# Patient Record
Sex: Female | Born: 1955 | Race: White | Hispanic: No | Marital: Married | Smoking: Never smoker
Health system: Southern US, Community
[De-identification: ages and names within clinical notes are randomized; demographics above are authoritative.]

## PROBLEM LIST (undated history)

## (undated) DIAGNOSIS — R51 Headache: Secondary | ICD-10-CM

## (undated) DIAGNOSIS — R569 Unspecified convulsions: Secondary | ICD-10-CM

## (undated) DIAGNOSIS — M1990 Unspecified osteoarthritis, unspecified site: Secondary | ICD-10-CM

## (undated) DIAGNOSIS — C801 Malignant (primary) neoplasm, unspecified: Secondary | ICD-10-CM

## (undated) DIAGNOSIS — K5792 Diverticulitis of intestine, part unspecified, without perforation or abscess without bleeding: Secondary | ICD-10-CM

## (undated) DIAGNOSIS — N189 Chronic kidney disease, unspecified: Secondary | ICD-10-CM

## (undated) HISTORY — PX: RECTAL SURGERY: SHX760

## (undated) HISTORY — PX: CHOLECYSTECTOMY: SHX55

## (undated) HISTORY — PX: TUBAL LIGATION: SHX77

---

## 2002-01-01 ENCOUNTER — Emergency Department (HOSPITAL_COMMUNITY): Admission: EM | Admit: 2002-01-01 | Discharge: 2002-01-01 | Payer: Self-pay | Admitting: Emergency Medicine

## 2011-10-07 ENCOUNTER — Encounter (HOSPITAL_COMMUNITY)
Admission: EM | Disposition: A | Discharge status: Home / Self Care | Payer: Self-pay | Source: Home / Self Care | Attending: Urology

## 2011-10-07 ENCOUNTER — Inpatient Hospital Stay (HOSPITAL_COMMUNITY): Payer: BC Managed Care – PPO | Admitting: Anesthesiology

## 2011-10-07 ENCOUNTER — Ambulatory Visit: Admit: 2011-10-07 | Payer: Self-pay | Admitting: Urology

## 2011-10-07 ENCOUNTER — Encounter (HOSPITAL_COMMUNITY): Payer: Self-pay | Admitting: Anesthesiology

## 2011-10-07 ENCOUNTER — Emergency Department (HOSPITAL_COMMUNITY): Payer: BC Managed Care – PPO

## 2011-10-07 ENCOUNTER — Inpatient Hospital Stay (HOSPITAL_COMMUNITY)
Admission: EM | Admit: 2011-10-07 | Discharge: 2011-10-08 | DRG: 323 | Disposition: A | Discharge status: Home / Self Care | Payer: BC Managed Care – PPO | Attending: Urology | Admitting: Urology

## 2011-10-07 ENCOUNTER — Encounter (HOSPITAL_COMMUNITY): Payer: Self-pay | Admitting: *Deleted

## 2011-10-07 DIAGNOSIS — N200 Calculus of kidney: Principal | ICD-10-CM | POA: Diagnosis present

## 2011-10-07 DIAGNOSIS — N1330 Unspecified hydronephrosis: Secondary | ICD-10-CM | POA: Diagnosis present

## 2011-10-07 DIAGNOSIS — N138 Other obstructive and reflux uropathy: Secondary | ICD-10-CM

## 2011-10-07 HISTORY — PX: CYSTOSCOPY W/ URETERAL STENT PLACEMENT: SHX1429

## 2011-10-07 LAB — COMPREHENSIVE METABOLIC PANEL
AST: 23 U/L (ref 0–37)
Albumin: 4.4 g/dL (ref 3.5–5.2)
Chloride: 100 mEq/L (ref 96–112)
Creatinine, Ser: 0.66 mg/dL (ref 0.50–1.10)
Potassium: 3.6 mEq/L (ref 3.5–5.1)
Total Bilirubin: 0.4 mg/dL (ref 0.3–1.2)

## 2011-10-07 LAB — URINALYSIS, ROUTINE W REFLEX MICROSCOPIC
Ketones, ur: NEGATIVE mg/dL
Nitrite: POSITIVE — AB
Protein, ur: NEGATIVE mg/dL
Urobilinogen, UA: 0.2 mg/dL (ref 0.0–1.0)

## 2011-10-07 LAB — CBC WITH DIFFERENTIAL/PLATELET
Basophils Absolute: 0 10*3/uL (ref 0.0–0.1)
Basophils Relative: 0 % (ref 0–1)
MCHC: 35.4 g/dL (ref 30.0–36.0)
Monocytes Absolute: 0.4 10*3/uL (ref 0.1–1.0)
Neutro Abs: 4.5 10*3/uL (ref 1.7–7.7)
Neutrophils Relative %: 65 % (ref 43–77)
RDW: 11.7 % (ref 11.5–15.5)

## 2011-10-07 LAB — URINE MICROSCOPIC-ADD ON

## 2011-10-07 SURGERY — CYSTOSCOPY, WITH RETROGRADE PYELOGRAM AND URETERAL STENT INSERTION
Anesthesia: General | Site: Ureter | Laterality: Left | Wound class: Clean Contaminated

## 2011-10-07 MED ORDER — DEXTROSE 5 % IV SOLN
1.0000 g | Freq: Once | INTRAVENOUS | Status: AC
  Administered 2011-10-07: 1 g via INTRAVENOUS
  Filled 2011-10-07: qty 10

## 2011-10-07 MED ORDER — HYDROMORPHONE HCL PF 1 MG/ML IJ SOLN
1.0000 mg | Freq: Once | INTRAMUSCULAR | Status: AC
  Administered 2011-10-07: 1 mg via INTRAVENOUS
  Filled 2011-10-07: qty 1

## 2011-10-07 MED ORDER — EPHEDRINE SULFATE 50 MG/ML IJ SOLN
INTRAMUSCULAR | Status: DC | PRN
  Administered 2011-10-07: 5 mg via INTRAVENOUS

## 2011-10-07 MED ORDER — SUCCINYLCHOLINE CHLORIDE 20 MG/ML IJ SOLN
INTRAMUSCULAR | Status: DC | PRN
  Administered 2011-10-07: 100 mg via INTRAVENOUS

## 2011-10-07 MED ORDER — ONDANSETRON HCL 4 MG/2ML IJ SOLN: 4.0000 mg | INTRAMUSCULAR | Status: DC | PRN

## 2011-10-07 MED ORDER — LACTATED RINGERS IV SOLN: INTRAVENOUS | Status: DC

## 2011-10-07 MED ORDER — OMEGA-3-ACID ETHYL ESTERS 1 G PO CAPS
2.0000 g | ORAL_CAPSULE | Freq: Every day | ORAL | Status: DC
  Administered 2011-10-08: 2 g via ORAL
  Filled 2011-10-07: qty 2

## 2011-10-07 MED ORDER — DEXTROSE-NACL 5-0.45 % IV SOLN
INTRAVENOUS | Status: DC
  Administered 2011-10-08: via INTRAVENOUS

## 2011-10-07 MED ORDER — HYDROCODONE-ACETAMINOPHEN 5-325 MG PO TABS: 1.0000 | ORAL_TABLET | ORAL | Status: DC | PRN

## 2011-10-07 MED ORDER — FENTANYL CITRATE 0.05 MG/ML IJ SOLN
INTRAMUSCULAR | Status: DC | PRN
  Administered 2011-10-07 (×2): 50 ug via INTRAVENOUS

## 2011-10-07 MED ORDER — ACETAMINOPHEN 10 MG/ML IV SOLN
INTRAVENOUS | Status: DC | PRN
  Administered 2011-10-07: 1000 mg via INTRAVENOUS

## 2011-10-07 MED ORDER — ZOLPIDEM TARTRATE 5 MG PO TABS: 5.0000 mg | ORAL_TABLET | Freq: Every evening | ORAL | Status: DC | PRN

## 2011-10-07 MED ORDER — FENTANYL CITRATE 0.05 MG/ML IJ SOLN: 25.0000 ug | INTRAMUSCULAR | Status: DC | PRN

## 2011-10-07 MED ORDER — PROPOFOL 10 MG/ML IV EMUL
INTRAVENOUS | Status: DC | PRN
  Administered 2011-10-07: 150 mg via INTRAVENOUS

## 2011-10-07 MED ORDER — MIDAZOLAM HCL 5 MG/5ML IJ SOLN
INTRAMUSCULAR | Status: DC | PRN
  Administered 2011-10-07: 1 mg via INTRAVENOUS

## 2011-10-07 MED ORDER — HYDROMORPHONE HCL PF 1 MG/ML IJ SOLN: 0.5000 mg | INTRAMUSCULAR | Status: DC | PRN

## 2011-10-07 MED ORDER — CLONAZEPAM 0.5 MG PO TABS: 0.5000 mg | ORAL_TABLET | Freq: Two times a day (BID) | ORAL | Status: DC | PRN

## 2011-10-07 MED ORDER — ONDANSETRON HCL 4 MG/2ML IJ SOLN
4.0000 mg | Freq: Once | INTRAMUSCULAR | Status: AC
  Administered 2011-10-07: 4 mg via INTRAVENOUS
  Filled 2011-10-07: qty 2

## 2011-10-07 MED ORDER — LACTATED RINGERS IV SOLN
INTRAVENOUS | Status: DC | PRN
  Administered 2011-10-07 (×2): via INTRAVENOUS

## 2011-10-07 MED ORDER — METOCLOPRAMIDE HCL 5 MG/ML IJ SOLN
10.0000 mg | Freq: Once | INTRAMUSCULAR | Status: AC
  Administered 2011-10-07: 10 mg via INTRAVENOUS
  Filled 2011-10-07: qty 2

## 2011-10-07 MED ORDER — IOHEXOL 300 MG/ML  SOLN
INTRAMUSCULAR | Status: DC | PRN
  Administered 2011-10-07: 10 mL

## 2011-10-07 MED ORDER — LACOSAMIDE 50 MG PO TABS
50.0000 mg | ORAL_TABLET | Freq: Two times a day (BID) | ORAL | Status: DC
  Administered 2011-10-08: 50 mg via ORAL
  Filled 2011-10-07: qty 1

## 2011-10-07 MED ORDER — LORAZEPAM 2 MG/ML IJ SOLN
1.0000 mg | Freq: Once | INTRAMUSCULAR | Status: AC
  Administered 2011-10-07: 1 mg via INTRAVENOUS
  Filled 2011-10-07: qty 1

## 2011-10-07 MED ORDER — LIDOCAINE HCL (CARDIAC) 20 MG/ML IV SOLN
INTRAVENOUS | Status: DC | PRN
  Administered 2011-10-07: 30 mg via INTRAVENOUS

## 2011-10-07 SURGICAL SUPPLY — 16 items
ADAPTER CATH URET PLST 4-6FR (CATHETERS) ×2 IMPLANT
BAG URO CATCHER STRL LF (DRAPE) ×2 IMPLANT
CATH INTERMIT  6FR 70CM (CATHETERS) IMPLANT
CATH URET 5FR 28IN CONE TIP (BALLOONS) ×1
CATH URET 5FR 70CM CONE TIP (BALLOONS) ×1 IMPLANT
CLOTH BEACON ORANGE TIMEOUT ST (SAFETY) ×2 IMPLANT
DRAPE CAMERA CLOSED 9X96 (DRAPES) ×2 IMPLANT
GLOVE BIOGEL M 7.0 STRL (GLOVE) ×2 IMPLANT
GOWN STRL NON-REIN LRG LVL3 (GOWN DISPOSABLE) ×2 IMPLANT
MANIFOLD NEPTUNE II (INSTRUMENTS) ×2 IMPLANT
MARKER SKIN DUAL TIP RULER LAB (MISCELLANEOUS) ×2 IMPLANT
NS IRRIG 1000ML POUR BTL (IV SOLUTION) ×2 IMPLANT
PACK CYSTO (CUSTOM PROCEDURE TRAY) ×2 IMPLANT
STENT CONTOUR 6FRX24X.038 (STENTS) ×2 IMPLANT
TUBING CONNECTING 10 (TUBING) ×2 IMPLANT
WIRE COONS/BENSON .038X145CM (WIRE) ×2 IMPLANT

## 2011-10-07 NOTE — ED Provider Notes (Signed)
Date: 10/07/2011  1811  Rate: 105  Rhythm: sinus tachycardia  QRS Axis: normal  Intervals: normal  ST/T Wave abnormalities: normal  Conduction Disutrbances:none  Narrative Interpretation:  Sinus tachycardia  Old EKG Reviewed: none available  8:41 PM Lab and radiology results reviewed and discussed with patient and with Dr. Bebe Shaggy.  Symptoms moderately improved with IV fluids and medications, but patient continues to be uncomfortable with continued episodes of vomiting. Consulted with Dr. Marlou Porch requests patient transfer to [facility] OR now for stent placement.    Jimmye Norman, NP 10/07/11 [phone]

## 2011-10-07 NOTE — ED Notes (Signed)
Patient transported to CT 

## 2011-10-07 NOTE — ED Notes (Signed)
Pt nauseated and having pain again. Ambulatory to restroom

## 2011-10-07 NOTE — ED Notes (Signed)
Pt medicated with ativan to help calm her. Pt could be heard moaning on other side of nurses station. She is very anxious and hyperventilating

## 2011-10-07 NOTE — Transfer of Care (Signed)
Immediate Anesthesia Transfer of Care Note  Patient: Amy Jones  Procedure(s) Performed: Procedure(s) (LRB) with comments: CYSTOSCOPY WITH RETROGRADE PYELOGRAM/URETERAL STENT PLACEMENT (Left)  Patient Location: PACU  Anesthesia Type: General  Level of Consciousness: awake and alert   Airway & Oxygen Therapy: Patient Spontanous Breathing and Patient connected to face mask oxygen  Post-op Assessment: Report given to PACU RN  Post vital signs: Reviewed and stable  Complications: No apparent anesthesia complications

## 2011-10-07 NOTE — H&P (Signed)
History and Physical  Chief Complaint: Severe left flank pain.  History of Present Illness: The patient is a 56 years old female who presented to the ER today with a history of sudden onset of severe left flank pain associated with nausea and vomiting.  She lives in [place] and was in [place] on a business trip. She was treated with IV analgesics with some pain relief.  However the pain has been recurring in spite of several pain medications.  CT scan showed an 8 mm stone in the lower pole of the left kidney and a 6 mm UPJ calculus with moderate hydronephrosis.  Since she has remained symptomatic will place JJ stent to relieve obstruction.  History reviewed. No pertinent past medical history. History reviewed. No pertinent past surgical history.  Medications: Klonopin, Lacosamide, Fish oil Allergies:  Allergies  Allergen Reactions  . Codeine Nausea And Vomiting    No family history on file. Social History:  reports that she has never smoked. She does not have any smokeless tobacco history on file. She reports that she does not drink alcohol. Her drug history not on file.  ROS: All systems are reviewed and negative except as noted.   Physical Exam:  Vital signs in last 24 hours: Temp:  [98.2 F (36.8 C)-98.6 F (37 C)] 98.4 F (36.9 C) (09/19 1939) Pulse Rate:  [98-120] 120  (09/19 2100) Resp:  [16-18] 16  (09/19 1939) BP: (150-170)/(72-90) 150/73 mmHg (09/19 2100) SpO2:  [95 %-100 %] 95 % (09/19 2100) Weight:  [140 lb (63.504 kg)] 140 lb (63.504 kg) (09/19 1939)  Cardiovascular: Skin warm; not flushed Respiratory: Breaths quiet; no shortness of breath Abdomen: No masses Neurological: Normal sensation to touch Musculoskeletal: Normal motor function arms and legs Lymphatics: No inguinal adenopathy Skin: No rashes Genitourinary:Normal female genitalia.  Laboratory Data:  Results for orders placed during the hospital encounter of 10/07/11 (from the past 24 hour(s))  CBC  WITH DIFFERENTIAL     Status: Normal   Collection Time   10/07/11  3:28 PM      Component Value Range   WBC 6.9  4.0 - 10.5 K/uL   RBC 4.48  3.87 - 5.11 MIL/uL   Hemoglobin 13.7  12.0 - 15.0 g/dL   HCT 16.1  09.6 - 04.5 %   MCV 86.4  78.0 - 100.0 fL   MCH 30.6  26.0 - 34.0 pg   MCHC 35.4  30.0 - 36.0 g/dL   RDW 40.9  81.1 - 91.4 %   Platelets 293  150 - 400 K/uL   Neutrophils Relative 65  43 - 77 %   Neutro Abs 4.5  1.7 - 7.7 K/uL   Lymphocytes Relative 28  12 - 46 %   Lymphs Abs 1.9  0.7 - 4.0 K/uL   Monocytes Relative 6  3 - 12 %   Monocytes Absolute 0.4  0.1 - 1.0 K/uL   Eosinophils Relative 1  0 - 5 %   Eosinophils Absolute 0.1  0.0 - 0.7 K/uL   Basophils Relative 0  0 - 1 %   Basophils Absolute 0.0  0.0 - 0.1 K/uL  COMPREHENSIVE METABOLIC PANEL     Status: Abnormal   Collection Time   10/07/11  3:28 PM      Component Value Range   Sodium 138  135 - 145 mEq/L   Potassium 3.6  3.5 - 5.1 mEq/L   Chloride 100  96 - 112 mEq/L   CO2 28  19 -  32 mEq/L   Glucose, Bld 100 (*) 70 - 99 mg/dL   BUN 19  6 - 23 mg/dL   Creatinine, Ser 2.44  0.50 - 1.10 mg/dL   Calcium 01.0  8.4 - 27.2 mg/dL   Total Protein 7.7  6.0 - 8.3 g/dL   Albumin 4.4  3.5 - 5.2 g/dL   AST 23  0 - 37 U/L   ALT 24  0 - 35 U/L   Alkaline Phosphatase 88  39 - 117 U/L   Total Bilirubin 0.4  0.3 - 1.2 mg/dL   GFR calc non Af Amer >90  >90 mL/min   GFR calc Af Amer >90  >90 mL/min  URINALYSIS, ROUTINE W REFLEX MICROSCOPIC     Status: Abnormal   Collection Time   10/07/11  3:31 PM      Component Value Range   Color, Urine YELLOW  YELLOW   APPearance CLOUDY (*) CLEAR   Specific Gravity, Urine 1.018  1.005 - 1.030   pH 5.5  5.0 - 8.0   Glucose, UA NEGATIVE  NEGATIVE mg/dL   Hgb urine dipstick LARGE (*) NEGATIVE   Bilirubin Urine NEGATIVE  NEGATIVE   Ketones, ur NEGATIVE  NEGATIVE mg/dL   Protein, ur NEGATIVE  NEGATIVE mg/dL   Urobilinogen, UA 0.2  0.0 - 1.0 mg/dL   Nitrite POSITIVE (*) NEGATIVE   Leukocytes,  UA SMALL (*) NEGATIVE  URINE MICROSCOPIC-ADD ON     Status: Abnormal   Collection Time   10/07/11  3:31 PM      Component Value Range   Squamous Epithelial / LPF RARE  RARE   WBC, UA 7-10  <3 WBC/hpf   RBC / HPF TOO NUMEROUS TO COUNT  <3 RBC/hpf   Bacteria, UA FEW (*) RARE   No results found for this or any previous visit (from the past 240 hour(s)). Creatinine:  Basename 10/07/11 1528  CREATININE 0.66    Impression/Assessment:  Left renal calculus.  Left UPJ calculus.  Left hydronephrosis.  Plan:  Cystoscopy, Left retrograde pyelogram, JJ stent.  Teddy Pena-HENRY 10/07/2011, 9:27 PM

## 2011-10-07 NOTE — Anesthesia Postprocedure Evaluation (Signed)
  Anesthesia Post-op Note  Patient: Amy Jones  Procedure(s) Performed: Procedure(s) (LRB): CYSTOSCOPY WITH RETROGRADE PYELOGRAM/URETERAL STENT PLACEMENT (Left)  Patient Location: PACU  Anesthesia Type: General  Level of Consciousness: awake and alert   Airway and Oxygen Therapy: Patient Spontanous Breathing  Post-op Pain: mild  Post-op Assessment: Post-op Vital signs reviewed, Patient's Cardiovascular Status Stable, Respiratory Function Stable, Patent Airway and No signs of Nausea or vomiting  Post-op Vital Signs: stable  Complications: No apparent anesthesia complications

## 2011-10-07 NOTE — ED Notes (Signed)
Advised the EDP that the patient inquired about taking her night dose of Vimpat.  NP to speak with urologist to see if the patient needs to remain NPO.

## 2011-10-07 NOTE — Op Note (Signed)
Amy Jones is a 56 y.o.   10/07/2011  General  Pre-op diagnosis: Left UPJ calculus, left renal calculus.  Postop diagnosis: Same  Procedure done: Cystoscopy, left retrograde pyelogram, insertion of double-J stent  Surgeon: Wendie Simmer. Carmie Lanpher  Anesthesia: General  Indication: Patient is a 56 years old female who presented to the emergency room today with sudden onset of severe left flank pain associated with nausea and vomiting. The pain was not relieved by IV analgesics. CT scan revealed an 8 mm stone in the lower pole of the left kidney and a 6 mm stone at the UPJ with moderate hydronephrosis. Patient was advised to have cystoscopy and double-J stent insertion. The procedure, risks and benefits were explained to the patient and her husband. The risks include but are not limited to hemorrhage, infection, ureteral injury. They understand and are agreeable.  Procedure: The patient was identified by her wrist band and proper timeout was taken.  Under general anesthesia she was prepped and draped and placed in the dorsolithotomy position. A panendoscope was inserted in the bladder. The bladder mucosa is normal. There is no stone or tumor in the bladder. The ureteral orifices are in normal position and shape.  Retrograde pyelogram:  A cone tip catheter was passed through the cystoscope and the left ureteral orifice. 10 cc of Omnipaque were injected through the cone-tip catheter. The distal, mid and proximal ureter appear normal. There is a filling defect at the UPJ. The calyces are moderately dilated. The cone-tip catheter was removed.  A sensor wire was passed through the cystoscope and the left ureter. A #[id number] French-[id number] double-J stent was passed over the sensor wire. The proximal curl of the double-J stent is in the renal pelvis, the distal curl is in the bladder. The bladder was then emptied and the cystoscope and sensor wire removed.  The patient tolerated the procedure well and left the OR in  satisfactory condition to postanesthesia care unit.

## 2011-10-07 NOTE — ED Notes (Signed)
Pt is calm. She has returned from ct. Tolerated well.

## 2011-10-07 NOTE — ED Notes (Signed)
The pt has had lt flank pain for the past 2 hours and in her lower abd.  No previous history.  lmp  none

## 2011-10-07 NOTE — Anesthesia Preprocedure Evaluation (Addendum)

## 2011-10-07 NOTE — ED Notes (Signed)
The patient is AOx4 and stable for transport to [facility].

## 2011-10-07 NOTE — ED Notes (Signed)
Called and gave report to Dorinda, Charity fundraiser at [employer].

## 2011-10-07 NOTE — ED Provider Notes (Signed)
History     CSN: [id number]  Arrival date & time 10/07/11  1519   First MD Initiated Contact with Patient 10/07/11 1652      Chief Complaint  Patient presents with  . Flank Pain     Patient is a 56 y.o. female presenting with flank pain. The history is provided by the patient.  Flank Pain This is a new problem. The current episode started 3 to 5 hours ago. The problem occurs constantly. The problem has been gradually worsening. Associated symptoms include abdominal pain. Pertinent negatives include no chest pain and no shortness of breath. Nothing aggravates the symptoms. Nothing relieves the symptoms. She has tried nothing for the symptoms. The treatment provided no relief.  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  She had otherwise been well.  She reports multiple episodes of vomiting. No fever No cp/sob She reports distant h/o kidney stone that required extraction She denies any recent workup for kidney stone  PMH - seizures, kidney stone  History reviewed. No pertinent past surgical history.  No family history on file.  History  Substance Use Topics  . Smoking status: Never Smoker   . Smokeless tobacco: Not on file  . Alcohol Use: No    OB History    Grav Para Term Preterm Abortions TAB SAB Ect Mult Living                  Review of Systems  Constitutional: Negative for fever.  Respiratory: Negative for shortness of breath.   Cardiovascular: Negative for chest pain.  Gastrointestinal: Positive for abdominal pain.  Genitourinary: Positive for flank pain.  All other systems reviewed and are negative.    Allergies  Review of patient's allergies indicates not on file.  Home Medications  No current outpatient prescriptions on file.  BP 163/75  Pulse 98  Temp 98.3 F (36.8 C) (Oral)  Resp 18  SpO2 99%  Physical Exam CONSTITUTIONAL: Well developed/well nourished, ill appearing,  vomiting HEAD AND FACE: Normocephalic/atraumatic EYES: EOMI/PERRL ENMT: Mucous membranes moist NECK: supple no meningeal signs SPINE:entire spine nontender CV: S1/S2 noted, no murmurs/rubs/gallops noted LUNGS: Lungs are clear to auscultation bilaterally, no apparent distress ABDOMEN: soft, nontender, no rebound or guarding WU:JWJX cva tenderness, no bruising, no erythema/rash NEURO: Pt is awake/alert, moves all extremitiesx4 EXTREMITIES: pulses normal, full ROM SKIN: warm, color normal PSYCH: no abnormalities of mood noted  ED Course  Procedures  Labs Reviewed  URINALYSIS, ROUTINE W REFLEX MICROSCOPIC - Abnormal; Notable for the following:    APPearance CLOUDY (*)     Hgb urine dipstick LARGE (*)     Nitrite POSITIVE (*)     Leukocytes, UA SMALL (*)     All other components within normal limits  COMPREHENSIVE METABOLIC PANEL - Abnormal; Notable for the following:    Glucose, Bld 100 (*)     All other components within normal limits  URINE MICROSCOPIC-ADD ON - Abnormal; Notable for the following:    Bacteria, UA FEW (*)     All other components within normal limits  CBC WITH DIFFERENTIAL  URINE CULTURE  5:03 PM Pt with acute onset of left flank pain with vomiting She likely has ureteral stone, but also concerned for concomitant uti.  Urine culture sent and rocephin ordered Ct imaging ordered.  ekg ordered D/w CDU PA will place in CDU for further monitoring/pain control   MDM  Nursing notes including past  medical history and social history reviewed and considered in documentation Labs/vital reviewed and considered         Joya Gaskins, MD 10/08/11 [phone]

## 2011-10-08 ENCOUNTER — Other Ambulatory Visit: Payer: Self-pay | Admitting: Urology

## 2011-10-08 ENCOUNTER — Encounter (HOSPITAL_COMMUNITY): Payer: Self-pay | Admitting: Urology

## 2011-10-08 MED ORDER — HYDROMORPHONE HCL 2 MG PO TABS: 2.0000 mg | ORAL_TABLET | ORAL | Status: DC | PRN

## 2011-10-08 MED FILL — Belladonna Alkaloids & Opium Suppos 16.2-60 MG: RECTAL | Qty: 1 | Status: AC

## 2011-10-08 NOTE — ED Provider Notes (Signed)
Medical screening examination/treatment/procedure(s) were conducted as a shared visit with non-physician practitioner(s) and myself.  I personally evaluated the patient during the encounter   Joya Gaskins, MD 10/08/11 [phone]

## 2011-10-08 NOTE — Discharge Summary (Signed)
Patient ID: Amy Jones MRN: [id number] DOB/AGE: November 02, 1955 56 y.o.  Admit date: 10/07/2011 Discharge date: 10/08/2011  Admission Diagnoses: Urinary tract obstruction due to kidney stone [592.0, 599.69] Back Pain  Discharge Diagnoses:  Active Problems:  * No active hospital problems. *    Discharged Condition:  Improved  Hospital Course:Admitted last night for left UPJ calculus.  Had cystoscopy, JJ stent placement last night.Feels fine this morning.  No flank pain. Voids well.  Urine clear   Significant Diagnostic Studies: Ct Abdomen Pelvis Wo Contrast  10/07/2011  *RADIOLOGY REPORT*  Clinical Data: Left flank pain.  Chills.  Nausea.  CT ABDOMEN AND PELVIS WITHOUT CONTRAST  Technique:  Multidetector CT imaging of the abdomen and pelvis was performed following the standard protocol without intravenous contrast.  Comparison: None.  Findings: The lung bases are clear.  No focal nodule, mass, or airspace disease is present.  The heart size is normal.  There is no significant pleural or pericardial effusion.  The liver and spleen are within normal limits.  A small hiatal hernia is present.  The stomach, duodenum, and pancreas are otherwise unremarkable.  The common bile duct is within normal limits following cholecystectomy.  The adrenal glands are normal bilaterally.  A punctate nonobstructing stone is present at the upper pole of the right kidney.  A nonobstructing 8 mm stone is present at the lower pole of the left kidney.  An obstructing left to UPJ stone measures 6 mm moderate left-sided hydronephrosis is present.  There is some stranding about the left kidney as well.  The more distal ureter is within normal limits.  The right ureter is unremarkable.  Postsurgical changes are noted in the distal rectosigmoid colon. Minimal diverticular changes are present.  There is no inflammatory change to suggest diverticulitis.  The remainder of the colon is within normal limits.  The appendix is not  discretely visualized and may be surgically absent.  The small bowel is unremarkable.  A calcified fibroid is present to along the right posterior aspect of the uterus.  The uterus and adnexa are otherwise within normal limits for age.  No significant to adenopathy or free fluid is present.  The bone windows demonstrate no focal lytic or blastic lesions.  Mild disc disease is present at L3-4 and L4-5.  IMPRESSION:  1.  Obstructing 6 mm left UPJ stone. 2.  Nonobstructing 8 mm left lower pole kidney stone. 3.  Moderate left-sided hydronephrosis. 4.  Punctate nonobstructing right upper pole kidney stone. 5.  Previous surgery at the rectosigmoid colon. 6.  Status post cholecystectomy. 7.  Sigmoid diverticulosis without to evidence for diverticulitis. 8.  Small hiatal hernia.   Original Report Authenticated By: Jamesetta Orleans. MATTERN, M.D.     Treatments: Cystoscopy, left JJ stent placement.  Will need definitive management of the stones with local urologist.  Discharge Exam: Blood pressure 126/53, pulse 70, temperature 98.5 F (36.9 C), temperature source Oral, resp. rate 16, height 5\' 4"  (1.626 m), weight 142 lb 6.7 oz (64.6 kg), SpO2 100.00%. Lungs clear.  Heart: regular rhythm. Abdomen: Soft non distended, non tender.  No CVA tenderness  Disposition: Discharge home.    Medication List     As of 10/08/2011  8:23 AM    TAKE these medications         clonazePAM 0.5 MG tablet   Commonly known as: KLONOPIN   Take 0.5 mg by mouth 2 (two) times daily as needed. For anxiety      fish oil-omega-3  fatty acids 1000 MG capsule   Take 2 g by mouth daily.      HYDROmorphone 2 MG tablet   Commonly known as: DILAUDID   Take 1 tablet (2 mg total) by mouth every 4 (four) hours as needed for pain.      lacosamide 50 MG Tabs   Commonly known as: VIMPAT   Take 50 mg by mouth 2 (two) times daily.         Signed: Mylee Falin-HENRY 10/08/2011, 8:23 AM

## 2011-10-11 LAB — URINE CULTURE: Colony Count: >=100000

## 2011-10-12 ENCOUNTER — Encounter (HOSPITAL_COMMUNITY): Payer: Self-pay | Admitting: *Deleted

## 2011-10-12 NOTE — Pre-Procedure Instructions (Signed)
Asked to bring blue folder the day of the procedure,insurance card,I.D. driver's license,wear comfortable clothing and have a driver for the day. Asked not to take Advil,Motrin,Ibuprofen,Aleve or any NSAIDS, Aspirin, or Toradol for 72 hours prior to procedure,  No vitamins or herbal medications 7 days prior to procedure. Instructed to take laxative per doctor's office instructions and eat a light dinner the evening before procedure.   To arrive at 1030 for lithotripsy procedure.  

## 2011-10-14 ENCOUNTER — Ambulatory Visit (HOSPITAL_COMMUNITY)
Admission: RE | Admit: 2011-10-14 | Discharge: 2011-10-14 | Disposition: A | Discharge status: Home / Self Care | Payer: BC Managed Care – PPO | Source: Ambulatory Visit | Attending: Urology | Admitting: Urology

## 2011-10-14 ENCOUNTER — Encounter (HOSPITAL_COMMUNITY)
Admission: RE | Disposition: A | Discharge status: Home / Self Care | Payer: Self-pay | Source: Ambulatory Visit | Attending: Urology

## 2011-10-14 ENCOUNTER — Encounter (HOSPITAL_COMMUNITY): Payer: Self-pay | Admitting: *Deleted

## 2011-10-14 ENCOUNTER — Ambulatory Visit (HOSPITAL_COMMUNITY): Payer: BC Managed Care – PPO

## 2011-10-14 DIAGNOSIS — N201 Calculus of ureter: Secondary | ICD-10-CM | POA: Insufficient documentation

## 2011-10-14 HISTORY — DX: Unspecified osteoarthritis, unspecified site: M19.90

## 2011-10-14 HISTORY — DX: Unspecified convulsions: R56.9

## 2011-10-14 HISTORY — DX: Headache: R51

## 2011-10-14 HISTORY — DX: Chronic kidney disease, unspecified: N18.9

## 2011-10-14 HISTORY — DX: Malignant (primary) neoplasm, unspecified: C80.1

## 2011-10-14 SURGERY — LITHOTRIPSY, ESWL
Anesthesia: LOCAL | Laterality: Left

## 2011-10-14 MED ORDER — DIPHENHYDRAMINE HCL 25 MG PO CAPS
25.0000 mg | ORAL_CAPSULE | ORAL | Status: AC
  Administered 2011-10-14: 25 mg via ORAL
  Filled 2011-10-14: qty 1

## 2011-10-14 MED ORDER — DIAZEPAM 5 MG PO TABS
10.0000 mg | ORAL_TABLET | ORAL | Status: AC
  Administered 2011-10-14: 10 mg via ORAL
  Filled 2011-10-14: qty 2

## 2011-10-14 MED ORDER — DEXTROSE-NACL 5-0.45 % IV SOLN
INTRAVENOUS | Status: DC
  Administered 2011-10-14: 12:00:00 via INTRAVENOUS

## 2011-10-14 MED ORDER — CIPROFLOXACIN HCL 500 MG PO TABS
500.0000 mg | ORAL_TABLET | ORAL | Status: AC
  Administered 2011-10-14: 500 mg via ORAL
  Filled 2011-10-14: qty 1

## 2011-10-14 NOTE — H&P (Signed)
History and Physical  Chief Complaint: Left flank pain.  Left renal calculi  History of Present Illness: Amy Jones had a left JJ inserted on 9/19 for severe left flank pain secondary to a 6 mm obstructing UPJ calculus.   She is scheduled for ESL of the UPJ calculus.  Past Medical History  Diagnosis Date  . Seizures   . Headache   . Chronic kidney disease     kidney stones  . Cancer     H/O colon cancer  . Arthritis     neck   Past Surgical History  Procedure Date  . Cystoscopy w/ ureteral stent placement 10/07/2011    Procedure: CYSTOSCOPY WITH RETROGRADE PYELOGRAM/URETERAL STENT PLACEMENT;  Surgeon: Lindaann Slough, MD;  Location: [facility]S;  Service: Urology;  Laterality: Left;  . Rectal surgery     2001  . Cholecystectomy     2001  . Tubal ligation     1986    Medications: Klonopin, Lacosamide, Fish Oil Allergies:  Allergies  Allergen Reactions  . Codeine Nausea And Vomiting    History reviewed. No pertinent family history. Social History:  reports that she has never smoked. She has never used smokeless tobacco. She reports that she does not drink alcohol or use illicit drugs.  ROS: All systems are reviewed and negative except as noted.   Physical Exam:  Vital signs in last 24 hours: Temp:  [98.7 F (37.1 C)] 98.7 F (37.1 C) (09/26 1046) Pulse Rate:  [86] 86  (09/26 1046) Resp:  [20] 20  (09/26 1046) BP: (129)/(72) 129/72 mmHg (09/26 1046) SpO2:  [100 %] 100 % (09/26 1046)  Cardiovascular: Skin warm; not flushed Respiratory: Breaths quiet; no shortness of breath Abdomen: No masses Neurological: Normal sensation to touch Musculoskeletal: Normal motor function arms and legs Lymphatics: No inguinal adenopathy Skin: No rashes Genitourinary:Normal female genitalia.  Laboratory Data:  No results found for this or any previous visit (from the past 24 hour(s)). Recent Results (from the past 240 hour(s))  URINE CULTURE     Status: Normal   Collection Time   10/07/11  3:31 PM      Component Value Range Status Comment   Specimen Description URINE, CLEAN CATCH   Final    Special Requests ADDED 10/07/11 2024   Final    Culture  Setup Time 10/07/2011 20:55   Final    Colony Count >=100,000 COLONIES/ML   Final    Culture     Final    Value: ESCHERICHIA COLI     Note: Two isolates with different morphologies were identified as the same organism.The most resistant organism was reported.   Report Status 10/11/2011 FINAL   Final    Organism ID, Bacteria ESCHERICHIA COLI   Final    Creatinine:  Basename 10/07/11 1528  CREATININE 0.66    Impression/Assessment:  Left renal calculi  Plan:  ESL left UPJ calculus  Amy Jones 10/14/2011, 12:59 PM

## 2011-10-14 NOTE — Op Note (Signed)
Refer to [facility] Op Note scanned in the chart 

## 2015-11-13 ENCOUNTER — Encounter (HOSPITAL_COMMUNITY): Payer: Self-pay | Admitting: Emergency Medicine

## 2015-11-13 ENCOUNTER — Observation Stay (HOSPITAL_COMMUNITY)
Admission: EM | Admit: 2015-11-13 | Discharge: 2015-11-14 | Disposition: A | Discharge status: Home / Self Care | Payer: 59 | Attending: Internal Medicine | Admitting: Internal Medicine

## 2015-11-13 ENCOUNTER — Encounter (HOSPITAL_COMMUNITY)
Admission: EM | Disposition: A | Discharge status: Home / Self Care | Payer: Self-pay | Source: Home / Self Care | Attending: Emergency Medicine

## 2015-11-13 DIAGNOSIS — R197 Diarrhea, unspecified: Secondary | ICD-10-CM | POA: Insufficient documentation

## 2015-11-13 DIAGNOSIS — K920 Hematemesis: Secondary | ICD-10-CM | POA: Diagnosis not present

## 2015-11-13 DIAGNOSIS — K2100 Gastro-esophageal reflux disease with esophagitis, without bleeding: Secondary | ICD-10-CM | POA: Insufficient documentation

## 2015-11-13 DIAGNOSIS — K222 Esophageal obstruction: Secondary | ICD-10-CM | POA: Insufficient documentation

## 2015-11-13 DIAGNOSIS — R110 Nausea: Secondary | ICD-10-CM | POA: Insufficient documentation

## 2015-11-13 DIAGNOSIS — K2210 Ulcer of esophagus without bleeding: Secondary | ICD-10-CM | POA: Insufficient documentation

## 2015-11-13 DIAGNOSIS — Z87442 Personal history of urinary calculi: Secondary | ICD-10-CM | POA: Diagnosis not present

## 2015-11-13 DIAGNOSIS — K449 Diaphragmatic hernia without obstruction or gangrene: Secondary | ICD-10-CM | POA: Diagnosis not present

## 2015-11-13 DIAGNOSIS — K208 Other esophagitis without bleeding: Secondary | ICD-10-CM | POA: Diagnosis present

## 2015-11-13 DIAGNOSIS — R1310 Dysphagia, unspecified: Secondary | ICD-10-CM | POA: Diagnosis not present

## 2015-11-13 DIAGNOSIS — Z85038 Personal history of other malignant neoplasm of large intestine: Secondary | ICD-10-CM | POA: Diagnosis not present

## 2015-11-13 DIAGNOSIS — R569 Unspecified convulsions: Secondary | ICD-10-CM | POA: Insufficient documentation

## 2015-11-13 DIAGNOSIS — Z79899 Other long term (current) drug therapy: Secondary | ICD-10-CM | POA: Diagnosis not present

## 2015-11-13 DIAGNOSIS — F419 Anxiety disorder, unspecified: Secondary | ICD-10-CM | POA: Insufficient documentation

## 2015-11-13 DIAGNOSIS — Z9049 Acquired absence of other specified parts of digestive tract: Secondary | ICD-10-CM | POA: Insufficient documentation

## 2015-11-13 HISTORY — DX: Diverticulitis of intestine, part unspecified, without perforation or abscess without bleeding: K57.92

## 2015-11-13 HISTORY — PX: ESOPHAGOGASTRODUODENOSCOPY: SHX5428

## 2015-11-13 LAB — CBC WITH DIFFERENTIAL/PLATELET
BASOS ABS: 0 10*3/uL (ref 0.0–0.1)
BASOS PCT: 0 %
EOS ABS: 0.1 10*3/uL (ref 0.0–0.7)
EOS PCT: 0 %
HCT: 39.9 % (ref 36.0–46.0)
Hemoglobin: 13.8 g/dL (ref 12.0–15.0)
Lymphocytes Relative: 7 %
Lymphs Abs: 1.1 10*3/uL (ref 0.7–4.0)
MCH: 30.1 pg (ref 26.0–34.0)
MCHC: 34.6 g/dL (ref 30.0–36.0)
MCV: 86.9 fL (ref 78.0–100.0)
MONO ABS: 1.4 10*3/uL — AB (ref 0.1–1.0)
Monocytes Relative: 9 %
NEUTROS ABS: 12.2 10*3/uL — AB (ref 1.7–7.7)
Neutrophils Relative %: 84 %
PLATELETS: 294 10*3/uL (ref 150–400)
RBC: 4.59 MIL/uL (ref 3.87–5.11)
RDW: 11.9 % (ref 11.5–15.5)
WBC: 14.8 10*3/uL — ABNORMAL HIGH (ref 4.0–10.5)

## 2015-11-13 LAB — COMPREHENSIVE METABOLIC PANEL
ALBUMIN: 4.5 g/dL (ref 3.5–5.0)
ALT: 31 U/L (ref 14–54)
ANION GAP: 7 (ref 5–15)
AST: 32 U/L (ref 15–41)
Alkaline Phosphatase: 85 U/L (ref 38–126)
BUN: 20 mg/dL (ref 6–20)
CHLORIDE: 104 mmol/L (ref 101–111)
CO2: 25 mmol/L (ref 22–32)
Calcium: 9.2 mg/dL (ref 8.9–10.3)
Creatinine, Ser: 0.66 mg/dL (ref 0.44–1.00)
GFR calc non Af Amer: >60 mL/min (ref >60)
GLUCOSE: 103 mg/dL — AB (ref 65–99)
Potassium: 4.4 mmol/L (ref 3.5–5.1)
SODIUM: 136 mmol/L (ref 135–145)
Total Bilirubin: 1.1 mg/dL (ref 0.3–1.2)
Total Protein: 7.5 g/dL (ref 6.5–8.1)

## 2015-11-13 LAB — POC OCCULT BLOOD, ED: FECAL OCCULT BLD: NEGATIVE

## 2015-11-13 LAB — SAMPLE TO BLOOD BANK

## 2015-11-13 LAB — LIPASE, BLOOD: LIPASE: 23 U/L (ref 11–51)

## 2015-11-13 LAB — TSH: TSH: 0.703 u[IU]/mL (ref 0.350–4.500)

## 2015-11-13 LAB — PROTIME-INR
INR: 0.92
PROTHROMBIN TIME: 12.4 s (ref 11.4–15.2)

## 2015-11-13 SURGERY — EGD (ESOPHAGOGASTRODUODENOSCOPY)
Anesthesia: Moderate Sedation

## 2015-11-13 MED ORDER — ONDANSETRON HCL 4 MG/2ML IJ SOLN: 4.0000 mg | Freq: Four times a day (QID) | INTRAMUSCULAR | Status: DC | PRN

## 2015-11-13 MED ORDER — ONDANSETRON HCL 4 MG PO TABS: 4.0000 mg | ORAL_TABLET | Freq: Four times a day (QID) | ORAL | Status: DC | PRN

## 2015-11-13 MED ORDER — PROMETHAZINE HCL 25 MG/ML IJ SOLN
INTRAMUSCULAR | Status: AC
  Administered 2015-11-13: 25 mg
  Filled 2015-11-13: qty 1

## 2015-11-13 MED ORDER — MEPERIDINE HCL 100 MG/ML IJ SOLN
INTRAMUSCULAR | Status: DC | PRN
  Administered 2015-11-13: 50 mg via INTRAVENOUS
  Administered 2015-11-13: 25 mg via INTRAVENOUS

## 2015-11-13 MED ORDER — SODIUM CHLORIDE 0.9 % IV BOLUS (SEPSIS)
500.0000 mL | Freq: Once | INTRAVENOUS | Status: AC
  Administered 2015-11-13: 500 mL via INTRAVENOUS

## 2015-11-13 MED ORDER — LIDOCAINE VISCOUS 2 % MT SOLN
OROMUCOSAL | Status: DC | PRN
  Administered 2015-11-13: 5 mL via OROMUCOSAL

## 2015-11-13 MED ORDER — ACETAMINOPHEN 325 MG PO TABS: 650.0000 mg | ORAL_TABLET | Freq: Four times a day (QID) | ORAL | Status: DC | PRN

## 2015-11-13 MED ORDER — CLONAZEPAM 0.5 MG PO TABS: 0.5000 mg | ORAL_TABLET | Freq: Two times a day (BID) | ORAL | Status: DC | PRN

## 2015-11-13 MED ORDER — LIDOCAINE VISCOUS 2 % MT SOLN
OROMUCOSAL | Status: AC
  Filled 2015-11-13: qty 15

## 2015-11-13 MED ORDER — STERILE WATER FOR IRRIGATION IR SOLN
Status: DC | PRN
  Administered 2015-11-13: 4 mL

## 2015-11-13 MED ORDER — MIDAZOLAM HCL 5 MG/5ML IJ SOLN
INTRAMUSCULAR | Status: AC
  Administered 2015-11-13: 2 mg via INTRAVENOUS
  Filled 2015-11-13: qty 5

## 2015-11-13 MED ORDER — ONDANSETRON HCL 4 MG/2ML IJ SOLN
INTRAMUSCULAR | Status: AC
  Filled 2015-11-13: qty 2

## 2015-11-13 MED ORDER — OMEGA-3-ACID ETHYL ESTERS 1 G PO CAPS
2.0000 g | ORAL_CAPSULE | Freq: Every day | ORAL | Status: DC
  Administered 2015-11-14: 2 g via ORAL
  Filled 2015-11-13: qty 2

## 2015-11-13 MED ORDER — MEPERIDINE HCL 100 MG/ML IJ SOLN
INTRAMUSCULAR | Status: AC
  Filled 2015-11-13: qty 2

## 2015-11-13 MED ORDER — PROMETHAZINE HCL 25 MG/ML IJ SOLN: 25.0000 mg | Freq: Once | INTRAMUSCULAR | Status: DC

## 2015-11-13 MED ORDER — MIDAZOLAM HCL 10 MG/2ML IJ SOLN
2.0000 mg | Freq: Once | INTRAMUSCULAR | Status: DC
  Filled 2015-11-13: qty 0.4

## 2015-11-13 MED ORDER — SODIUM CHLORIDE 0.9 % IV SOLN
INTRAVENOUS | Status: DC
  Administered 2015-11-13: 13:00:00 via INTRAVENOUS

## 2015-11-13 MED ORDER — PROMETHAZINE HCL 25 MG/ML IJ SOLN: 25.0000 mg | Freq: Four times a day (QID) | INTRAMUSCULAR | Status: DC | PRN

## 2015-11-13 MED ORDER — PANTOPRAZOLE SODIUM 40 MG IV SOLR
40.0000 mg | Freq: Once | INTRAVENOUS | Status: AC
  Administered 2015-11-13: 40 mg via INTRAVENOUS
  Filled 2015-11-13: qty 40

## 2015-11-13 MED ORDER — SODIUM CHLORIDE 0.9 % IV BOLUS (SEPSIS)
1000.0000 mL | Freq: Once | INTRAVENOUS | Status: AC
  Administered 2015-11-13: 1000 mL via INTRAVENOUS

## 2015-11-13 MED ORDER — MIDAZOLAM HCL 5 MG/5ML IJ SOLN
INTRAMUSCULAR | Status: AC
  Filled 2015-11-13: qty 10

## 2015-11-13 MED ORDER — SODIUM CHLORIDE 0.9 % IV SOLN
INTRAVENOUS | Status: DC
  Administered 2015-11-13: 10 mL/h via INTRAVENOUS

## 2015-11-13 MED ORDER — MIDAZOLAM HCL 5 MG/5ML IJ SOLN
INTRAMUSCULAR | Status: DC | PRN
  Administered 2015-11-13: 2 mg via INTRAVENOUS
  Administered 2015-11-13: 1 mg via INTRAVENOUS

## 2015-11-13 MED ORDER — SODIUM CHLORIDE 0.9% FLUSH
3.0000 mL | Freq: Two times a day (BID) | INTRAVENOUS | Status: DC
  Administered 2015-11-13 (×2): 3 mL via INTRAVENOUS

## 2015-11-13 MED ORDER — ACETAMINOPHEN 650 MG RE SUPP: 650.0000 mg | Freq: Four times a day (QID) | RECTAL | Status: DC | PRN

## 2015-11-13 MED ORDER — PANTOPRAZOLE SODIUM 40 MG IV SOLR
40.0000 mg | Freq: Two times a day (BID) | INTRAVENOUS | Status: DC
  Administered 2015-11-14: 40 mg via INTRAVENOUS
  Filled 2015-11-13 (×2): qty 40

## 2015-11-13 MED ORDER — ONDANSETRON HCL 4 MG/2ML IJ SOLN
4.0000 mg | Freq: Once | INTRAMUSCULAR | Status: AC
  Administered 2015-11-13: 4 mg via INTRAVENOUS
  Filled 2015-11-13: qty 2

## 2015-11-13 MED ORDER — ONDANSETRON HCL 4 MG/2ML IJ SOLN
INTRAMUSCULAR | Status: DC | PRN
  Administered 2015-11-13: 4 mg via INTRAVENOUS

## 2015-11-13 MED ORDER — SODIUM CHLORIDE 0.9% FLUSH
INTRAVENOUS | Status: AC
  Administered 2015-11-13: 14:00:00
  Filled 2015-11-13: qty 10

## 2015-11-13 MED ORDER — METOCLOPRAMIDE HCL 5 MG/ML IJ SOLN: 5.0000 mg | Freq: Once | INTRAMUSCULAR | Status: DC

## 2015-11-13 NOTE — ED Triage Notes (Signed)
Pt c/o vomiting bright red blood tonight and c/o nausea.

## 2015-11-13 NOTE — ED Provider Notes (Addendum)
[facility] DEPT Provider Note   CSN: [id number] Arrival date & time: 11/13/15  0409   Time seen 04:28 AM  History   Chief Complaint Chief Complaint  Patient presents with  . Hematemesis    HPI Amy Jones is a 60 y.o. female.  HPI patient states she woke up having a lot of indigestion with burning in her throat. She complains of a lot of nausea and then she vomited once and states it had a lot of blood in it. Her husband said it looked like "watermelon juice" in the toilet. Since then she's been having dry heaves. She states she is started having heartburn a lot and started Nexium 3 days ago. She also had one episode of diarrhea tonight. She states she has noted after she eats in about 3 or 4 hours she starts getting upper abdominal discomfort and cramping and then she'll have a bowel movement and her pain improves. She denies any rectal bleeding or black stools. She states she's having abdominal cramping. She denies feeling dizziness or lightheaded. She states she's never had this before. She states she had endoscopy several years ago which showed some erosions on her esophagus. She also has had a colonoscopy in the past. This was done in [place] and then by Dr. Collene Mares in [place].   PCP [facility] in [place]  Past Medical History:  Diagnosis Date  . Arthritis    neck  . Cancer [facility])    H/O colon cancer  . Chronic kidney disease    kidney stones  . Diverticulitis   . Headache(784.0)   . Seizures [facility])     Patient Active Problem List   Diagnosis Date Noted  . Hematemesis/vomiting blood 11/13/2015    Past Surgical History:  Procedure Laterality Date  . CHOLECYSTECTOMY     2001  . CYSTOSCOPY W/ URETERAL STENT PLACEMENT  10/07/2011   Procedure: CYSTOSCOPY WITH RETROGRADE PYELOGRAM/URETERAL STENT PLACEMENT;  Surgeon: Hanley Ben, MD;  Location: [facility];  Service: Urology;  Laterality: Left;  . RECTAL SURGERY     2001  . TUBAL LIGATION     1986    OB History    No data available       Home Medications    Prior to Admission medications   Medication Sig Start Date End Date Taking? Authorizing Provider  clonazePAM (KLONOPIN) 0.5 MG tablet Take 0.5 mg by mouth 2 (two) times daily as needed. For anxiety   Yes Historical Provider, MD  fish oil-omega-3 fatty acids 1000 MG capsule Take 2 g by mouth daily.   Yes Historical Provider, MD  HYDROmorphone (DILAUDID) 2 MG tablet Take 1 tablet (2 mg total) by mouth every 4 (four) hours as needed for pain. 10/08/11   Lowella Bandy, MD  lacosamide (VIMPAT) 50 MG TABS Take 50 mg by mouth 2 (two) times daily.    Historical Provider, MD    Family History History reviewed. No pertinent family history.  Social History Social History  Substance Use Topics  . Smoking status: Never Smoker  . Smokeless tobacco: Never Used  . Alcohol use No  employed   Allergies   Codeine   Review of Systems Review of Systems  All other systems reviewed and are negative.    Physical Exam Updated Vital Signs BP 150/74 (BP Location: Left Arm)   Pulse 115   Temp 98.3 F (36.8 C) (Oral)   Resp 20   Ht 5\' 4"  (1.626 m)   Wt 145 lb (  65.8 kg)   SpO2 100%   BMI 24.89 kg/m   Vital signs normal except for tachycardia   Physical Exam  Constitutional: She is oriented to person, place, and time. She appears well-developed and well-nourished.  Non-toxic appearance. She does not appear ill. No distress.  HENT:  Head: Normocephalic and atraumatic.  Right Ear: External ear normal.  Left Ear: External ear normal.  Nose: Nose normal. No mucosal edema or rhinorrhea.  Mouth/Throat: Oropharynx is clear and moist and mucous membranes are normal. No dental abscesses or uvula swelling.  Eyes: Conjunctivae and EOM are normal. Pupils are equal, round, and reactive to light.  Neck: Normal range of motion and full passive range of motion without pain. Neck supple.  Cardiovascular: Normal rate, regular rhythm and  normal heart sounds.  Exam reveals no gallop and no friction rub.   No murmur heard. Pulmonary/Chest: Effort normal and breath sounds normal. No respiratory distress. She has no wheezes. She has no rhonchi. She has no rales. She exhibits no tenderness and no crepitus.  Abdominal: Soft. Normal appearance and bowel sounds are normal. She exhibits no distension. There is no tenderness. There is no rebound and no guarding.  Musculoskeletal: Normal range of motion. She exhibits no edema or tenderness.  Moves all extremities well.   Neurological: She is alert and oriented to person, place, and time. She has normal strength. No cranial nerve deficit.  Skin: Skin is warm, dry and intact. No rash noted. No erythema. No pallor.  Psychiatric: She has a normal mood and affect. Her speech is normal and behavior is normal. Her mood appears not anxious.  Nursing note and vitals reviewed.    ED Treatments / Results  Labs (all labs ordered are listed, but only abnormal results are displayed) Results for orders placed or performed during the hospital encounter of 11/13/15  Comprehensive metabolic panel  Result Value Ref Range   Sodium 136 135 - 145 mmol/L   Potassium 4.4 3.5 - 5.1 mmol/L   Chloride 104 101 - 111 mmol/L   CO2 25 22 - 32 mmol/L   Glucose, Bld 103 (H) 65 - 99 mg/dL   BUN 20 6 - 20 mg/dL   Creatinine, Ser 0.66 0.44 - 1.00 mg/dL   Calcium 9.2 8.9 - 10.3 mg/dL   Total Protein 7.5 6.5 - 8.1 g/dL   Albumin 4.5 3.5 - 5.0 g/dL   AST 32 15 - 41 U/L   ALT 31 14 - 54 U/L   Alkaline Phosphatase 85 38 - 126 U/L   Total Bilirubin 1.1 0.3 - 1.2 mg/dL   GFR calc non Af Amer >60 >60 mL/min   GFR calc Af Amer >60 >60 mL/min   Anion gap 7 5 - 15  Lipase, blood  Result Value Ref Range   Lipase 23 11 - 51 U/L  CBC with Differential  Result Value Ref Range   WBC 14.8 (H) 4.0 - 10.5 K/uL   RBC 4.59 3.87 - 5.11 MIL/uL   Hemoglobin 13.8 12.0 - 15.0 g/dL   HCT 39.9 36.0 - 46.0 %   MCV 86.9 78.0 -  100.0 fL   MCH 30.1 26.0 - 34.0 pg   MCHC 34.6 30.0 - 36.0 g/dL   RDW 11.9 11.5 - 15.5 %   Platelets 294 150 - 400 K/uL   Neutrophils Relative % 84 %   Neutro Abs 12.2 (H) 1.7 - 7.7 K/uL   Lymphocytes Relative 7 %   Lymphs Abs 1.1 0.7 -  4.0 K/uL   Monocytes Relative 9 %   Monocytes Absolute 1.4 (H) 0.1 - 1.0 K/uL   Eosinophils Relative 0 %   Eosinophils Absolute 0.1 0.0 - 0.7 K/uL   Basophils Relative 0 %   Basophils Absolute 0.0 0.0 - 0.1 K/uL  POC occult blood, ED RN will collect  Result Value Ref Range   Fecal Occult Bld NEGATIVE NEGATIVE  Sample to Blood Bank  Result Value Ref Range   Blood Bank Specimen SAMPLE AVAILABLE FOR TESTING    Sample Expiration 11/16/2015    Laboratory interpretation all normal except for leukocytosis    EKG  EKG Interpretation None       Radiology No results found.  Procedures Procedures (including critical care time)  Medications Ordered in ED Medications  sodium chloride 0.9 % bolus 1,000 mL (0 mLs Intravenous Stopped 11/13/15 0636)  sodium chloride 0.9 % bolus 500 mL (500 mLs Intravenous New Bag/Given 11/13/15 0504)  pantoprazole (PROTONIX) injection 40 mg (40 mg Intravenous Given 11/13/15 0504)  ondansetron (ZOFRAN) injection 4 mg (4 mg Intravenous Given 11/13/15 0504)     Initial Impression / Assessment and Plan / ED Course  I have reviewed the triage vital signs and the nursing notes.  Pertinent labs & imaging results that were available during my care of the patient were reviewed by me and considered in my medical decision making (see chart for details).  Clinical Course   IV was started and patient was given a 1500 mL bolus, she was given IV Protonix. Lab work was ordered.  Recheck 06:00 Pt states her nausea is gone, her abdominal pain is gone. Discussed her lab results. I want to talk to GI about possible EDG today and patient is agreeable.  06:38 AM Dr Oneida Alar, have medicine admit to obs, keep NPO, will do EDG today.     06:58 AM Dr Lorin Mercy, hospitalitst, admit to obs, tele  07:20 AM Dr Hartford Poli, hospitalist, called to get report  Final Clinical Impressions(s) / ED Diagnoses   Final diagnoses:  Hematemesis with nausea   Plan admission for EDG  Rolland Porter, MD, Barbette Or, MD 11/13/15 [id number]    Rolland Porter, MD 11/13/15 [id number]    Rolland Porter, MD 11/13/15 0745

## 2015-11-13 NOTE — Op Note (Signed)
[facility] Patient Name: Amy Jones Procedure Date: 11/13/2015 2:57 PM MRN: [id number] Date of Birth: 1955/01/20 Attending MD: Norvel Richards , MD CSN: [id number] Age: 60 Admit Type: Outpatient Procedure:                Upper GI endoscopy Indications:              Dysphagia Providers:                Norvel Richards, MD, Gwynneth Albright RN,                            RN, Isabella Stalling, Technician Referring MD:              Medicines:                Midazolam 11 mg IV, Meperidine 75 mg IV Complications:            No immediate complications. Estimated Blood Loss:     Estimated blood loss: none. Procedure:                Pre-Anesthesia Assessment:                           - Prior to the procedure, a History and Physical                            was performed, and patient medications and                            allergies were reviewed. The patient's tolerance of                            previous anesthesia was also reviewed. The risks                            and benefits of the procedure and the sedation                            options and risks were discussed with the patient.                            All questions were answered, and informed consent                            was obtained. Prior Anticoagulants: The patient has                            taken no previous anticoagulant or antiplatelet                            agents. ASA Grade Assessment: II - A patient with                            mild systemic disease. After reviewing the risks  and benefits, the patient was deemed in                            satisfactory condition to undergo the procedure.                           After obtaining informed consent, the endoscope was                            passed under direct vision. Throughout the                            procedure, the patient's blood pressure, pulse, and                            oxygen  saturations were monitored continuously. The                            [id number] was introduced through the                            mouth, and advanced to the second part of duodenum.                            The upper GI endoscopy was accomplished without                            difficulty. The patient tolerated the procedure                            well. Scope In: 3:12:53 PM Scope Out: 3:16:19 PM Total Procedure Duration: 0 hours 3 minutes 26 seconds  Findings:      LA Grade C (one or more mucosal breaks continuous between tops of 2 or       more mucosal folds, less than 75% circumference) esophagitis was found       34 to 38 cm from the incisors. No Barrett's epithelium seen. Excoriated       GE junction superimposed on a noncritical Schatzki's ring. No active       bleeding (no blood in the upper GI tract)      A medium-sized hiatal hernia was present.      The exam was otherwise without abnormality.      The duodenal bulb and second portion of the duodenum were normal. Impression:               - LA Grade C esophagitis. I suspect a relatively                            trivial upper GI bleed most likely from her                            esophagitis.                           - Medium-sized hiatal hernia. Noncritical  Schatzki's ring                           - The examination was otherwise normal.                           - Normal duodenal bulb and second portion of the                            duodenum.                           - No specimens collected. Moderate Sedation:      Moderate (conscious) sedation was administered by the endoscopy nurse       and supervised by the endoscopist. The following parameters were       monitored: oxygen saturation, heart rate, blood pressure, respiratory       rate, EKG, adequacy of pulmonary ventilation, and response to care.       Total physician intraservice time was 11  minutes. Recommendation:           - Return patient to hospital ward for ongoing care.                           - Clear liquid diet. continue twice a day PPI                            therapy                           - Continue present medications. Procedure Code(s):        --- Professional ---                           [phone], Esophagogastroduodenoscopy, flexible,                            transoral; diagnostic, including collection of                            specimen(s) by brushing or washing, when performed                            (separate procedure)                           99152, Moderate sedation services provided by the                            same physician or other qualified health care                            professional performing the diagnostic or                            therapeutic service that the sedation supports,  requiring the presence of an independent trained                            observer to assist in the monitoring of the                            patient's level of consciousness and physiological                            status; initial 15 minutes of intraservice time,                            patient age 1 years or older Diagnosis Code(s):        --- Professional ---                           K20.9, Esophagitis, unspecified                           K44.9, Diaphragmatic hernia without obstruction or                            gangrene                           R13.10, Dysphagia, unspecified CPT copyright 2016 American Medical Association. All rights reserved. The codes documented in this report are preliminary and upon coder review may  be revised to meet current compliance requirements. Cristopher Estimable. Rourk, MD Norvel Richards, MD 11/13/2015 3:36:33 PM This report has been signed electronically. Number of Addenda: 0

## 2015-11-13 NOTE — Consult Note (Signed)
Referring Provider: No ref. provider found Primary Care Physician:  Donalynn Furlong, MD Primary Gastroenterologist:  Dr. Oneida Alar  Date of Admission: 11/13/15 Date of Consultation: 11/13/15  Reason for Consultation:  Hematemesis  HPI:  Amy Jones is a 60 y.o. female with a past medical history of arthritis, erosive esophagitis, colon CA s/p colectomy in 2001. She presented to the hospital for worsening indigestion 3 days ago at which point she started OTC Nexium. Felt better the night before presentation, but awoke around 2 am this morning and vomited blood. She also noted tot eh ER provider that after eating will have upper abdominal pain but this seems to improve after a bowel movement. States she had an EGD several years ago which showed erosions on her esophagus and has had 2 colonoscopies, one in [place], [place] and one by Dr. Collene Mares in [place].  ED work-up shows CMP essentially normal, lipase essentially normal, CBC with hgb 13.8, heme negative stool. She was admitted to the hospital, given IV Protonix and made NPO.  Today she states she has a history of GERD. Had remote EGD with erosions in her esophagus by Dr. Docia Furl in [place] and was started on Rx Nexium, for which she took for a  Number of years. Her symptoms improved at some point so she stopped taking Nexium and mostly managed her symptoms with trigger food avoidance. Her symptoms began returning 4-6 weeks ago including esophageal burning, reflux of bitter material. She also began having some dysphagia symptoms as well with "food getting stuck and I have to drink a lot to get it to pass" and seems to suggest it is uncomfortable when it does pass. Denies NSAID and ASA powder use. No further vomiting since arrival to the ER. Has had more nausea but Zofran has helped. Denies hematochezia, melena. No chest pain, dyspnea, dizziness, lightheadedness.   Additionally she notes she has had some lower abdominal cramping and 'feels like gas"  which improved withbowel movement. May have IBS, although this is not an urgent issue compared to her upper GI bleed. No other GI complaints.  Past Medical History:  Diagnosis Date  . Arthritis    neck  . Cancer [facility])    H/O colon cancer  . Chronic kidney disease    kidney stones  . Diverticulitis   . Headache(784.0)   . Seizures ([place])     Past Surgical History:  Procedure Laterality Date  . CHOLECYSTECTOMY     2001  . CYSTOSCOPY W/ URETERAL STENT PLACEMENT  10/07/2011   Procedure: CYSTOSCOPY WITH RETROGRADE PYELOGRAM/URETERAL STENT PLACEMENT;  Surgeon: Hanley Ben, MD;  Location: [facility];  Service: Urology;  Laterality: Left;  . RECTAL SURGERY     2001  . TUBAL LIGATION     1986    Prior to Admission medications   Medication Sig Start Date End Date Taking? Authorizing Provider  clonazePAM (KLONOPIN) 0.5 MG tablet Take 0.5 mg by mouth 2 (two) times daily as needed. For anxiety   Yes Historical Provider, MD  fish oil-omega-3 fatty acids 1000 MG capsule Take 2 g by mouth daily.   Yes Historical Provider, MD  HYDROmorphone (DILAUDID) 2 MG tablet Take 1 tablet (2 mg total) by mouth every 4 (four) hours as needed for pain. 10/08/11   Lowella Bandy, MD  lacosamide (VIMPAT) 50 MG TABS Take 50 mg by mouth 2 (two) times daily.    Historical Provider, MD    Current Facility-Administered Medications  Medication Dose Route Frequency Provider Last Rate Last  Dose  . pantoprazole (PROTONIX) injection 40 mg  40 mg Intravenous Q12H Verlee Monte, MD       Current Outpatient Prescriptions  Medication Sig Dispense Refill  . clonazePAM (KLONOPIN) 0.5 MG tablet Take 0.5 mg by mouth 2 (two) times daily as needed. For anxiety    . fish oil-omega-3 fatty acids 1000 MG capsule Take 2 g by mouth daily.    Marland Kitchen HYDROmorphone (DILAUDID) 2 MG tablet Take 1 tablet (2 mg total) by mouth every 4 (four) hours as needed for pain. 30 tablet 0  . lacosamide (VIMPAT) 50 MG TABS Take 50 mg by mouth 2 (two) times  daily.      Allergies as of 11/13/2015 - Review Complete 11/13/2015  Allergen Reaction Noted  . Codeine Nausea And Vomiting 10/07/2011    History reviewed. No pertinent family history.  Social History   Social History  . Marital status: Married    Spouse name: N/A  . Number of children: N/A  . Years of education: N/A   Occupational History  . Not on file.   Social History Main Topics  . Smoking status: Never Smoker  . Smokeless tobacco: Never Used  . Alcohol use No  . Drug use: No  . Sexual activity: Not on file   Other Topics Concern  . Not on file   Social History Narrative  . No narrative on file    Review of Systems: General: Negative for anorexia, weight loss, fever, chills, fatigue, weakness. Eyes: Negative for vision changes.  ENT: Negative for hoarseness. CV: Negative for chest pain, angina, palpitations, peripheral edema.  Respiratory: Negative for dyspnea at rest, cough, sputum, wheezing.  GI: See history of present illness. MS: Admits recent chronic back pain.  Derm: Negative for rash or itching.  Endo: Negative for unusual weight change.  Heme: Negative for bruising or bleeding. Allergy: Negative for rash or hives.  Physical Exam: Vital signs in last 24 hours: Temp:  [98.3 F (36.8 C)] 98.3 F (36.8 C) (10/26 0420) Pulse Rate:  [115] 115 (10/26 0420) Resp:  [20] 20 (10/26 0420) BP: (150)/(74) 150/74 (10/26 0420) SpO2:  [100 %] 100 % (10/26 0420) Weight:  [145 lb (65.8 kg)] 145 lb (65.8 kg) (10/26 0417)   General:   Alert,  Well-developed, well-nourished, pleasant and cooperative in NAD Head:  Normocephalic and atraumatic. Eyes:  Sclera clear, no icterus. Conjunctiva pink. Ears:  Normal auditory acuity. Neck:  Supple; no masses or thyromegaly. Lungs:  Clear throughout to auscultation.   No wheezes, crackles, or rhonchi. No acute distress. Heart:  Regular rate and rhythm; no murmurs, clicks, rubs, or gallops. Abdomen:  Soft, nontender and  nondistended. No masses, hepatosplenomegaly or hernias noted. Normal bowel sounds, without guarding, and without rebound.   Rectal:  Deferred.   Msk:  Symmetrical without gross deformities. Pulses:  Normal bilateral DP pulses noted. Neurologic:  Alert and  oriented x4;  grossly normal neurologically. Psych:  Alert and cooperative. Normal mood and affect.  Intake/Output from previous day: 10/25 0701 - 10/26 0700 In: 1000 [IV Piggyback:1000] Out: -  Intake/Output this shift: No intake/output data recorded.  Lab Results:  Recent Labs  11/13/15 0456  WBC 14.8*  HGB 13.8  HCT 39.9  PLT 294   BMET  Recent Labs  11/13/15 0456  NA 136  K 4.4  CL 104  CO2 25  GLUCOSE 103*  BUN 20  CREATININE 0.66  CALCIUM 9.2   LFT  Recent Labs  11/13/15 0456  PROT 7.5  ALBUMIN 4.5  AST 32  ALT 31  ALKPHOS 85  BILITOT 1.1   PT/INR No results for input(s): LABPROT, INR in the last 72 hours. Hepatitis Panel No results for input(s): HEPBSAG, HCVAB, HEPAIGM, HEPBIGM in the last 72 hours. C-Diff No results for input(s): CDIFFTOX in the last 72 hours.  Studies/Results: No results found.  Impression: 60 year old female with history of chronic GERD and erosive esophagitis who has not been on nexium for a while. Worsening of her symptoms 4-6 weeks ago including typical GERD/dyspepsia symptoms. Her symptoms became works 3 days ago, trialed OTC Nexium and felt like she was improving. Awoke early morning hours with significant hematemesis. Hgb stable at this point, no symptoms of anemia. No further UGI bleed, nausea managed with Zofran. Has been given IV protonix.   Possible etiologies of esophagitis, gastritis, erosive esophagitis, erosive gastritis, PUD, duodenitis, duodenal ulcers, Mallory-Weis tear. Last ate 9 pm last night. I discussed the possibility of EGD for further evaluation. The risks, benefits, and alternatives have been discussed with the patient in detail. The patient states  understanding and desires to proceed, if indicated.  The patient is on Klonopin for anxiety. PO Dilaudid a holdover from previous hospitalization. Will add 25 mg Phenergan to promote adequate sedation  Plan: 1. Monitor for further GI bleed 2. Zofran as needed 3. IV Protonix 40 mg bid (will contact pharmacy to add due to critical shortage) 4. Monitor H/H 5. Transfuse as necessary 6. Will discuss with Dr. Gala Romney possible add-on EGD   Thank you for allowing [place] to participate in the care of Marko Stai, DNP, AGNP-C Adult & Gerontological Nurse Practitioner [facility]    LOS: 0 days     11/13/2015, 8:33 AM

## 2015-11-13 NOTE — H&P (Signed)
History and Physical    Amy Jones [id number] DOB: [birth year] DOA: 11/13/2015  PCP: Donalynn Furlong, MD  Patient coming from: Home  Chief Complaint: Hematemesis  HPI: Amy Jones is a 60 y.o. female with medical history significant of erosive esophagitis, colon cancer status post partial colectomy done in 2001 and seizure. Patient came in to the hospital because of hematemesis. She reported lately she has had several episodes of indigestion, started to take OTC Nexium about 3 days ago. She was okay last night she woke up early this morning about 2 AM and she vomited blood so she came into the hospital for further evaluation. She denies any NSAIDs use.  ED Course:  Vitals: WNL Labs: WNL, hemoglobin is 13.8. Imaging: WNL Interventions: IV Protonix, GI consulted  Review of Systems:  Constitutional: negative for anorexia, fevers and sweats Eyes: negative for irritation, redness and visual disturbance Ears, nose, mouth, throat, and face: negative for earaches, epistaxis, nasal congestion and sore throat Respiratory: negative for cough, dyspnea on exertion, sputum and wheezing Cardiovascular: negative for chest pain, dyspnea, lower extremity edema, orthopnea, palpitations and syncope Gastrointestinal: negative for abdominal pain, constipation, diarrhea, melena, nausea and vomiting Genitourinary:negative for dysuria, frequency and hematuria Hematologic/lymphatic: negative for bleeding, easy bruising and lymphadenopathy Musculoskeletal:negative for arthralgias, muscle weakness and stiff joints Neurological: negative for coordination problems, gait problems, headaches and weakness Endocrine: negative for diabetic symptoms including polydipsia, polyuria and weight loss Allergic/Immunologic: negative for anaphylaxis, hay fever and urticaria  Past Medical History:  Diagnosis Date  . Arthritis    neck  . Cancer [facility])    H/O colon cancer  . Chronic kidney disease    kidney stones  .  Diverticulitis   . Headache(784.0)   . Seizures ([place])     Past Surgical History:  Procedure Laterality Date  . CHOLECYSTECTOMY     2001  . CYSTOSCOPY W/ URETERAL STENT PLACEMENT  10/07/2011   Procedure: CYSTOSCOPY WITH RETROGRADE PYELOGRAM/URETERAL STENT PLACEMENT;  Surgeon: Hanley Ben, MD;  Location: [facility];  Service: Urology;  Laterality: Left;  . RECTAL SURGERY     2001  . TUBAL LIGATION     1986     reports that she has never smoked. She has never used smokeless tobacco. She reports that she does not drink alcohol or use drugs.  Allergies  Allergen Reactions  . Codeine Nausea And Vomiting    Family history No history of colon cancer.  Prior to Admission medications   Medication Sig Start Date End Date Taking? Authorizing Provider  clonazePAM (KLONOPIN) 0.5 MG tablet Take 0.5 mg by mouth 2 (two) times daily as needed. For anxiety   Yes Historical Provider, MD  fish oil-omega-3 fatty acids 1000 MG capsule Take 2 g by mouth daily.   Yes Historical Provider, MD  HYDROmorphone (DILAUDID) 2 MG tablet Take 1 tablet (2 mg total) by mouth every 4 (four) hours as needed for pain. 10/08/11   Lowella Bandy, MD  lacosamide (VIMPAT) 50 MG TABS Take 50 mg by mouth 2 (two) times daily.    Historical Provider, MD    Physical Exam:  Vitals:   11/13/15 0417 11/13/15 0420  BP:  150/74  Pulse:  115  Resp:  20  Temp:  98.3 F (36.8 C)  TempSrc:  Oral  SpO2:  100%  Weight: 65.8 kg (145 lb)   Height: 5\' 4"  (1.626 m)     Constitutional: NAD, calm, comfortable Eyes: PERRL, lids and conjunctivae normal ENMT: Mucous membranes are moist.  Posterior pharynx clear of any exudate or lesions.Normal dentition.  Neck: normal, supple, no masses, no thyromegaly Respiratory: clear to auscultation bilaterally, no wheezing, no crackles. Normal respiratory effort. No accessory muscle use.  Cardiovascular: Regular rate and rhythm, no murmurs / rubs / gallops. No extremity edema. 2+ pedal pulses. No  carotid bruits.  Abdomen: no tenderness, no masses palpated. No hepatosplenomegaly. Bowel sounds positive.  Musculoskeletal: no clubbing / cyanosis. No joint deformity upper and lower extremities. Good ROM, no contractures. Normal muscle tone.  Skin: no rashes, lesions, ulcers. No induration Neurologic: CN 2-12 grossly intact. Sensation intact, DTR normal. Strength 5/5 in all 4.  Psychiatric: Normal judgment and insight. Alert and oriented x 3. Normal mood.   Labs on Admission: I have personally reviewed following labs and imaging studies  CBC:  Recent Labs Lab 11/13/15 0456  WBC 14.8*  NEUTROABS 12.2*  HGB 13.8  HCT 39.9  MCV 86.9  PLT [id number]   Basic Metabolic Panel:  Recent Labs Lab 11/13/15 0456  NA 136  K 4.4  CL 104  CO2 25  GLUCOSE 103*  BUN 20  CREATININE 0.66  CALCIUM 9.2   GFR: Estimated Creatinine Clearance: 68 mL/min (by C-G formula based on SCr of 0.66 mg/dL). Liver Function Tests:  Recent Labs Lab 11/13/15 0456  AST 32  ALT 31  ALKPHOS 85  BILITOT 1.1  PROT 7.5  ALBUMIN 4.5    Recent Labs Lab 11/13/15 0456  LIPASE 23   No results for input(s): AMMONIA in the last 168 hours. Coagulation Profile: No results for input(s): INR, PROTIME in the last 168 hours. Cardiac Enzymes: No results for input(s): CKTOTAL, CKMB, CKMBINDEX, TROPONINI in the last 168 hours. BNP (last 3 results) No results for input(s): PROBNP in the last 8760 hours. HbA1C: No results for input(s): HGBA1C in the last 72 hours. CBG: No results for input(s): GLUCAP in the last 168 hours. Lipid Profile: No results for input(s): CHOL, HDL, LDLCALC, TRIG, CHOLHDL, LDLDIRECT in the last 72 hours. Thyroid Function Tests: No results for input(s): TSH, T4TOTAL, FREET4, T3FREE, THYROIDAB in the last 72 hours. Anemia Panel: No results for input(s): VITAMINB12, FOLATE, FERRITIN, TIBC, IRON, RETICCTPCT in the last 72 hours. Urine analysis:    Component Value Date/Time   COLORURINE  YELLOW 10/07/2011 1531   APPEARANCEUR CLOUDY (A) 10/07/2011 1531   LABSPEC 1.018 10/07/2011 1531   PHURINE 5.5 10/07/2011 1531   GLUCOSEU NEGATIVE 10/07/2011 1531   HGBUR LARGE (A) 10/07/2011 1531   BILIRUBINUR NEGATIVE 10/07/2011 1531   KETONESUR NEGATIVE 10/07/2011 1531   PROTEINUR NEGATIVE 10/07/2011 1531   UROBILINOGEN 0.2 10/07/2011 1531   NITRITE POSITIVE (A) 10/07/2011 1531   LEUKOCYTESUR SMALL (A) 10/07/2011 1531   Sepsis Labs: !!!!!!!!!!!!!!!!!!!!!!!!!!!!!!!!!!!!!!!!!!!! Invalid input(s): PROCALCITONIN, LACTICIDVEN No results found for this or any previous visit (from the past 240 hour(s)).   Radiological Exams on Admission: No results found.  EKG: Independently reviewed.   Assessment/Plan Principal Problem:   Hematemesis/vomiting blood Active Problems:   Seizures (HCC)   History of colon cancer    Hematemesis -One episode of hematemesis earlier today, history of erosive esophagitis. -Stable hemoglobin at 13.8. -Nothing by mouth, started on IV Protonix twice a day. -GI to evaluate, likely to have EGD done.  Seizures -No recent seizure-like activity, continue as needed Klonopin.  History of colon cancer -Status post partial colectomy done in 2001 without chemotherapy.   DVT prophylaxis: SCDs Code Status: Full Family Communication: Plan D/W patient and husband at bedside. Disposition Plan:  Home Consults called:  Admission status: Observation   [facility] A MD [facility] Pager [phone]  If 7PM-7AM, please contact night-coverage [email or web address] Password [email or web address]  11/13/2015, 7:59 AM

## 2015-11-14 DIAGNOSIS — Z85038 Personal history of other malignant neoplasm of large intestine: Secondary | ICD-10-CM

## 2015-11-14 DIAGNOSIS — R569 Unspecified convulsions: Secondary | ICD-10-CM

## 2015-11-14 DIAGNOSIS — K208 Other esophagitis without bleeding: Secondary | ICD-10-CM | POA: Diagnosis present

## 2015-11-14 DIAGNOSIS — K920 Hematemesis: Secondary | ICD-10-CM | POA: Diagnosis not present

## 2015-11-14 LAB — CBC
HEMATOCRIT: 35.4 % — AB (ref 36.0–46.0)
HEMOGLOBIN: 11.8 g/dL — AB (ref 12.0–15.0)
MCH: 29.9 pg (ref 26.0–34.0)
MCHC: 33.3 g/dL (ref 30.0–36.0)
MCV: 89.8 fL (ref 78.0–100.0)
Platelets: 231 10*3/uL (ref 150–400)
RBC: 3.94 MIL/uL (ref 3.87–5.11)
RDW: 12 % (ref 11.5–15.5)
WBC: 4.2 10*3/uL (ref 4.0–10.5)

## 2015-11-14 LAB — BASIC METABOLIC PANEL
ANION GAP: 4 — AB (ref 5–15)
BUN: 11 mg/dL (ref 6–20)
CO2: 29 mmol/L (ref 22–32)
Calcium: 8.7 mg/dL — ABNORMAL LOW (ref 8.9–10.3)
Chloride: 107 mmol/L (ref 101–111)
Creatinine, Ser: 0.61 mg/dL (ref 0.44–1.00)
GFR calc Af Amer: >60 mL/min (ref >60)
Glucose, Bld: 89 mg/dL (ref 65–99)
POTASSIUM: 4 mmol/L (ref 3.5–5.1)
SODIUM: 140 mmol/L (ref 135–145)

## 2015-11-14 LAB — HEMOGLOBIN A1C
Hgb A1c MFr Bld: 5.1 % (ref 4.8–5.6)
MEAN PLASMA GLUCOSE: 100 mg/dL

## 2015-11-14 MED ORDER — PANTOPRAZOLE SODIUM 40 MG PO TBEC
40.0000 mg | DELAYED_RELEASE_TABLET | Freq: Two times a day (BID) | ORAL | Status: DC
Start: 2015-11-14 — End: 2015-11-14
  Administered 2015-11-14: 40 mg via ORAL
  Filled 2015-11-14: qty 1

## 2015-11-14 MED ORDER — PANTOPRAZOLE SODIUM 40 MG PO TBEC: 40.0000 mg | DELAYED_RELEASE_TABLET | Freq: Two times a day (BID) | ORAL | 2 refills | Status: AC

## 2015-11-14 NOTE — Progress Notes (Signed)
Pt IV and telemetry removed, tolerated well.  Reviewed discharge instructions with pt and family at bedside.  Answered questions at this time.  

## 2015-11-14 NOTE — Discharge Summary (Signed)
Physician Discharge Summary  Hollee Lamberton [id number] DOB: 1955-12-06 DOA: 11/13/2015  PCP: Donalynn Furlong, MD  Admit date: 11/13/2015 Discharge date: 11/14/2015  Admitted From: Home Disposition: Home  Recommendations for Outpatient Follow-up:  1. Follow up with GI in 2-3 weeks. 2. Please obtain BMP/CBC in one week  Home Health: N/A Equipment/Devices: N/A  Discharge Condition: Stable CODE STATUS: Full code Diet recommendation: Heart Healthy  Brief/Interim Summary: Amy Jones is a 60 y.o. female with medical history significant of erosive esophagitis, colon cancer status post partial colectomy done in 2001 and seizure. Patient came in to the hospital because of hematemesis. She reported lately she has had several episodes of indigestion, started to take OTC Nexium about 3 days ago. She was okay last night she woke up early this morning about 2 AM and she vomited blood so she came into the hospital for further evaluation. She denies any NSAIDs use.  Discharge Diagnoses:  Principal Problem:   Hematemesis/vomiting blood Active Problems:   Seizures (HCC)   History of colon cancer   Hematemesis secondary to LA grade C erosive esophagitis -One episode of hematemesis, history of erosive esophagitis. -Stable hemoglobin at 13.8. -Placed on IV Protonix twice a day on admission, GI was consulted. -EGD was done and showed LA grade C esophagitis. -Patient placed on Protonix twice a day, for 3 months, follow-up with GI as outpatient.  Seizures -No recent seizure-like activity, continue as needed Klonopin.  History of colon cancer -Status post partial colectomy done in 2001 without chemotherapy.    Discharge Instructions  Discharge Instructions    Diet - low sodium heart healthy    Complete by:  As directed    Increase activity slowly    Complete by:  As directed        Medication List    STOP taking these medications   lacosamide 50 MG Tabs tablet Commonly  known as:  VIMPAT     TAKE these medications   clonazePAM 0.5 MG tablet Commonly known as:  KLONOPIN Take 0.5 mg by mouth 2 (two) times daily as needed. For anxiety   fish oil-omega-3 fatty acids 1000 MG capsule Take 2 g by mouth daily.   pantoprazole 40 MG tablet Commonly known as:  PROTONIX Take 1 tablet (40 mg total) by mouth 2 (two) times daily before a meal.       Allergies  Allergen Reactions  . Ceclor [Cefaclor] Hives  . Codeine Nausea And Vomiting    Consultations:  GI   Procedures/Studies:  No results found. EGD Impression:                - LA Grade C esophagitis. I suspect a relatively trivial upper GI bleed most likely from her esophagitis. - Medium-sized hiatal hernia. Noncritical Schatzki's ring - The examination was otherwise normal. - Normal duodenal bulb and second portion of the duodenum. - No specimens collected.    Subjective:   Discharge Exam: Vitals:   11/13/15 2149 11/14/15 0528  BP: (!) 119/58 120/61  Pulse: 64 74  Resp:    Temp: 98.2 F (36.8 C) 98 F (36.7 C)   Vitals:   11/13/15 1530 11/13/15 1535 11/13/15 2149 11/14/15 0528  BP: (!) 116/50 (!) 112/53 (!) 119/58 120/61  Pulse: (!) 107 (!) 104 64 74  Resp: 13 11    Temp:   98.2 F (36.8 C) 98 F (36.7 C)  TempSrc:   Oral Oral  SpO2: 100% 98% 93% 95%  Weight:  Height:        General: Pt is alert, awake, not in acute distress Cardiovascular: RRR, S1/S2 +, no rubs, no gallops Respiratory: CTA bilaterally, no wheezing, no rhonchi Abdominal: Soft, NT, ND, bowel sounds + Extremities: no edema, no cyanosis    The results of significant diagnostics from this hospitalization (including imaging, microbiology, ancillary and laboratory) are listed below for reference.     Microbiology: No results found for this or any previous visit (from the past 240 hour(s)).   Labs: BNP (last 3 results) No results for input(s): BNP in the last 8760 hours. Basic Metabolic  Panel:  Recent Labs Lab 11/13/15 0456 11/14/15 0555  NA 136 140  K 4.4 4.0  CL 104 107  CO2 25 29  GLUCOSE 103* 89  BUN 20 11  CREATININE 0.66 0.61  CALCIUM 9.2 8.7*   Liver Function Tests:  Recent Labs Lab 11/13/15 0456  AST 32  ALT 31  ALKPHOS 85  BILITOT 1.1  PROT 7.5  ALBUMIN 4.5    Recent Labs Lab 11/13/15 0456  LIPASE 23   No results for input(s): AMMONIA in the last 168 hours. CBC:  Recent Labs Lab 11/13/15 0456 11/14/15 0555  WBC 14.8* 4.2  NEUTROABS 12.2*  --   HGB 13.8 11.8*  HCT 39.9 35.4*  MCV 86.9 89.8  PLT 294 231   Cardiac Enzymes: No results for input(s): CKTOTAL, CKMB, CKMBINDEX, TROPONINI in the last 168 hours. BNP: Invalid input(s): POCBNP CBG: No results for input(s): GLUCAP in the last 168 hours. D-Dimer No results for input(s): DDIMER in the last 72 hours. Hgb A1c  Recent Labs  11/13/15 1316  HGBA1C 5.1   Lipid Profile No results for input(s): CHOL, HDL, LDLCALC, TRIG, CHOLHDL, LDLDIRECT in the last 72 hours. Thyroid function studies  Recent Labs  11/13/15 1316  TSH 0.703   Anemia work up No results for input(s): VITAMINB12, FOLATE, FERRITIN, TIBC, IRON, RETICCTPCT in the last 72 hours. Urinalysis    Component Value Date/Time   COLORURINE YELLOW 10/07/2011 1531   APPEARANCEUR CLOUDY (A) 10/07/2011 1531   LABSPEC 1.018 10/07/2011 1531   PHURINE 5.5 10/07/2011 1531   GLUCOSEU NEGATIVE 10/07/2011 1531   HGBUR LARGE (A) 10/07/2011 1531   BILIRUBINUR NEGATIVE 10/07/2011 1531   KETONESUR NEGATIVE 10/07/2011 1531   PROTEINUR NEGATIVE 10/07/2011 1531   UROBILINOGEN 0.2 10/07/2011 1531   NITRITE POSITIVE (A) 10/07/2011 1531   LEUKOCYTESUR SMALL (A) 10/07/2011 1531   Sepsis Labs Invalid input(s): PROCALCITONIN,  WBC,  LACTICIDVEN Microbiology No results found for this or any previous visit (from the past 240 hour(s)).   Time coordinating discharge: Over 30 minutes  SIGNED:   Birdie Hopes, MD  [facility] 11/14/2015, 10:03 AM Pager   If 7PM-7AM, please contact night-coverage [email or web address] Password [email or web address]

## 2015-11-14 NOTE — Progress Notes (Signed)
    Subjective: Feels better today. No further N/V, no hematemesis. No hematochezia or melena. No other upper or lower GI complaints.  Objective: Vital signs in last 24 hours: Temp:  [97.8 F (36.6 C)-98.5 F (36.9 C)] 98 F (36.7 C) (10/27 0528) Pulse Rate:  [64-149] 74 (10/27 0528) Resp:  [11-21] 11 (10/26 1535) BP: (112-229)/(45-207) 120/61 (10/27 0528) SpO2:  [93 %-100 %] 95 % (10/27 0528) Weight:  [156 lb 12 oz (71.1 kg)] 156 lb 12 oz (71.1 kg) (10/26 1300) Last BM Date: 11/12/15 General:   Alert and oriented, pleasant Eyes:  No icterus, sclera clear. Conjuctiva pink.  Heart:  S1, S2 present, no murmurs noted.  Lungs: Clear to auscultation bilaterally, without wheezing, rales, or rhonchi.  Abdomen:  Bowel sounds present, soft, non-tender, non-distended. No HSM or hernias noted. No rebound or guarding. No masses appreciated  Msk:  Symmetrical without gross deformities. Extremities:  Without clubbing or edema. Neurologic:  Alert and  oriented x4;  grossly normal neurologically. Psych:  Alert and cooperative. Normal mood and affect.  Intake/Output from previous day: 10/26 0701 - 10/27 0700 In: 1040 [P.O.:240; I.V.:800] Out: -  Intake/Output this shift: No intake/output data recorded.  Lab Results:  Recent Labs  11/13/15 0456  WBC 14.8*  HGB 13.8  HCT 39.9  PLT 294   BMET  Recent Labs  11/13/15 0456  NA 136  K 4.4  CL 104  CO2 25  GLUCOSE 103*  BUN 20  CREATININE 0.66  CALCIUM 9.2   LFT  Recent Labs  11/13/15 0456  PROT 7.5  ALBUMIN 4.5  AST 32  ALT 31  ALKPHOS 85  BILITOT 1.1   PT/INR  Recent Labs  11/13/15 1316  LABPROT 12.4  INR 0.92   Hepatitis Panel No results for input(s): HEPBSAG, HCVAB, HEPAIGM, HEPBIGM in the last 72 hours.   Studies/Results: No results found.  Assessment: 60 year old female with history of chronic GERD and erosive esophagitis who has not been on nexium for a while. Worsening of her symptoms 4-6 weeks ago  including typical GERD/dyspepsia symptoms. Her symptoms became works 3 days ago, trialed OTC Nexium and felt like she was improving. Awoke early morning hours with significant hematemesis. Hgb stable at this point, no symptoms of anemia. No further UGI bleed, nausea managed with Zofran. Has been given IV protonix.   EGD completed yesterday which found LA grade C esophagitis, suspect a relatively trivial upper GI bleed from esophagitis, medium-sized hiatal hernia, non-critical Schatzki's ring, otherwise normal. Recommended clear liquid diet and bid PPI.  Clinically imroved today, wants to go home. Wants to advance diet to more substantial diet. No further obvious GI bleed. No hematemesis. Is on bid PPI and will need to be on this for 3 months then can consider titrating down to once a day. Regularly sees Dr. Collene Mares in [place] for GI   Plan: 1. Advance diet to soft 2. Continue bid PPI 3. Continue bid Protonix on discharge 4. Follow-up with primary GI on discharge    Thank you for allowing [place] to participate in the care of Amy Stai, DNP, AGNP-C Adult & Gerontological Nurse Practitioner [facility]    LOS: 0 days    11/14/2015, 7:04 AM

## 2015-11-14 NOTE — Care Management Note (Signed)
Case Management Note  Patient Details  Name: Amy Jones MRN: [id number] Date of Birth: 06/01/1955  Subjective/Objective: Patient adm from home with hematemesis. She is ind with ADL's, has PCP and insurance.              Action/Plan: Anticipate DC home with self care. No CM needs.    Expected Discharge Date:  11/14/15               Expected Discharge Plan:  Home/Self Care  In-House Referral:  NA  Discharge planning Services  CM Consult  Post Acute Care Choice:  NA Choice offered to:  NA  DME Arranged:    DME Agency:     HH Arranged:    HH Agency:     Status of Service:  Completed, signed off  If discussed at [employer] of Stay Meetings, dates discussed:    Additional Comments:  Sherlyne Crownover, Chauncey Reading, RN 11/14/2015, 7:50 AM

## 2015-11-19 ENCOUNTER — Encounter (HOSPITAL_COMMUNITY): Payer: Self-pay | Admitting: Internal Medicine

## 2019-02-15 ENCOUNTER — Emergency Department (HOSPITAL_COMMUNITY): Payer: Medicare Other

## 2019-02-15 ENCOUNTER — Other Ambulatory Visit: Payer: Self-pay

## 2019-02-15 ENCOUNTER — Encounter (HOSPITAL_COMMUNITY): Payer: Self-pay

## 2019-02-15 ENCOUNTER — Emergency Department (HOSPITAL_COMMUNITY)
Admission: EM | Admit: 2019-02-15 | Discharge: 2019-02-15 | Disposition: A | Discharge status: Home / Self Care | Payer: Medicare Other | Attending: Emergency Medicine | Admitting: Emergency Medicine

## 2019-02-15 ENCOUNTER — Telehealth: Payer: Self-pay | Admitting: *Deleted

## 2019-02-15 DIAGNOSIS — Z7982 Long term (current) use of aspirin: Secondary | ICD-10-CM | POA: Insufficient documentation

## 2019-02-15 DIAGNOSIS — K5792 Diverticulitis of intestine, part unspecified, without perforation or abscess without bleeding: Secondary | ICD-10-CM | POA: Diagnosis not present

## 2019-02-15 DIAGNOSIS — Z79899 Other long term (current) drug therapy: Secondary | ICD-10-CM | POA: Diagnosis not present

## 2019-02-15 DIAGNOSIS — R102 Pelvic and perineal pain: Secondary | ICD-10-CM | POA: Diagnosis present

## 2019-02-15 LAB — URINALYSIS, ROUTINE W REFLEX MICROSCOPIC
Bilirubin Urine: NEGATIVE
Glucose, UA: NEGATIVE mg/dL
Hgb urine dipstick: NEGATIVE
Ketones, ur: 20 mg/dL — AB
Leukocytes,Ua: NEGATIVE
Nitrite: NEGATIVE
Protein, ur: NEGATIVE mg/dL
Specific Gravity, Urine: 1.021 (ref 1.005–1.030)
pH: 6 (ref 5.0–8.0)

## 2019-02-15 LAB — COMPREHENSIVE METABOLIC PANEL
ALT: 37 U/L (ref 0–44)
AST: 23 U/L (ref 15–41)
Albumin: 4.6 g/dL (ref 3.5–5.0)
Alkaline Phosphatase: 80 U/L (ref 38–126)
Anion gap: 10 (ref 5–15)
BUN: 13 mg/dL (ref 8–23)
CO2: 28 mmol/L (ref 22–32)
Calcium: 9.5 mg/dL (ref 8.9–10.3)
Chloride: 99 mmol/L (ref 98–111)
Creatinine, Ser: 0.62 mg/dL (ref 0.44–1.00)
GFR calc Af Amer: >60 mL/min (ref >60)
GFR calc non Af Amer: >60 mL/min (ref >60)
Glucose, Bld: 103 mg/dL — ABNORMAL HIGH (ref 70–99)
Potassium: 4.6 mmol/L (ref 3.5–5.1)
Sodium: 137 mmol/L (ref 135–145)
Total Bilirubin: 1.4 mg/dL — ABNORMAL HIGH (ref 0.3–1.2)
Total Protein: 8.2 g/dL — ABNORMAL HIGH (ref 6.5–8.1)

## 2019-02-15 LAB — CBC
HCT: 43.4 % (ref 36.0–46.0)
Hemoglobin: 14.3 g/dL (ref 12.0–15.0)
MCH: 29.9 pg (ref 26.0–34.0)
MCHC: 32.9 g/dL (ref 30.0–36.0)
MCV: 90.8 fL (ref 80.0–100.0)
Platelets: 359 10*3/uL (ref 150–400)
RBC: 4.78 MIL/uL (ref 3.87–5.11)
RDW: 11.2 % — ABNORMAL LOW (ref 11.5–15.5)
WBC: 11.5 10*3/uL — ABNORMAL HIGH (ref 4.0–10.5)
nRBC: 0 % (ref 0.0–0.2)

## 2019-02-15 LAB — LIPASE, BLOOD: Lipase: 26 U/L (ref 11–51)

## 2019-02-15 MED ORDER — ONDANSETRON 4 MG PO TBDP: ORAL_TABLET | ORAL | 0 refills | Status: AC

## 2019-02-15 MED ORDER — SODIUM CHLORIDE 0.9 % IV BOLUS
500.0000 mL | Freq: Once | INTRAVENOUS | Status: AC
  Administered 2019-02-15: 500 mL via INTRAVENOUS

## 2019-02-15 MED ORDER — METRONIDAZOLE 500 MG PO TABS
500.0000 mg | ORAL_TABLET | Freq: Once | ORAL | Status: AC
  Administered 2019-02-15: 13:00:00 500 mg via ORAL
  Filled 2019-02-15: qty 1

## 2019-02-15 MED ORDER — CIPROFLOXACIN HCL 500 MG PO TABS: 500.0000 mg | ORAL_TABLET | Freq: Two times a day (BID) | ORAL | 0 refills | Status: AC

## 2019-02-15 MED ORDER — METRONIDAZOLE 500 MG PO TABS: 500.0000 mg | ORAL_TABLET | Freq: Four times a day (QID) | ORAL | 0 refills | Status: AC

## 2019-02-15 MED ORDER — OXYCODONE-ACETAMINOPHEN 5-325 MG PO TABS: 1.0000 | ORAL_TABLET | Freq: Four times a day (QID) | ORAL | 0 refills | Status: AC | PRN

## 2019-02-15 MED ORDER — IOHEXOL 300 MG/ML  SOLN
100.0000 mL | Freq: Once | INTRAMUSCULAR | Status: AC | PRN
  Administered 2019-02-15: 100 mL via INTRAVENOUS

## 2019-02-15 MED ORDER — CIPROFLOXACIN HCL 250 MG PO TABS
500.0000 mg | ORAL_TABLET | Freq: Once | ORAL | Status: AC
  Administered 2019-02-15: 500 mg via ORAL
  Filled 2019-02-15: qty 2

## 2019-02-15 NOTE — ED Notes (Signed)
[facility] called for clarification of flagyl administration instructions. Per pharmacy and EDP instruction, Flagyl dose is to be administered 500 mg QID X7 days. [facility] pharmacy staff aware.

## 2019-02-15 NOTE — ED Triage Notes (Signed)
Pt reports not feeling well since Saturday. Abdominal pain started Monday. Reports severe cramping . Pt went to [facility] yesterday and told to come to ED for CT to rule out diverticulitis

## 2019-02-15 NOTE — Discharge Instructions (Addendum)
Follow-up with your doctor next week for recheck.  Return if severe pain or vomiting or high fever

## 2019-02-15 NOTE — ED Provider Notes (Signed)
[facility] EMERGENCY DEPARTMENT Provider Note   CSN: [id number] Arrival date & time: 02/15/19  [id number]     History Chief Complaint  Patient presents with  . Abdominal Pain    Amy Jones is a 64 y.o. female.  Patient complains of abdominal pain.  Patient has a history of diverticulitis.  No vomiting no fever  The history is provided by the patient. No language interpreter was used.  Abdominal Pain Pain location:  Suprapubic Pain quality: aching   Pain radiates to:  Does not radiate Pain severity:  Moderate Onset quality:  Sudden Timing:  Constant Chronicity:  Recurrent Context: not alcohol use   Associated symptoms: no chest pain, no cough, no diarrhea, no fatigue and no hematuria        Past Medical History:  Diagnosis Date  . Arthritis    neck  . Cancer [facility])    H/O colon cancer  . Chronic kidney disease    kidney stones  . Diverticulitis   . Headache(784.0)   . Seizures [facility])     Patient Active Problem List   Diagnosis Date Noted  . [place] grade C esophagitis 11/14/2015  . Hematemesis/vomiting blood 11/13/2015  . Seizures ([place]) 11/13/2015  . History of colon cancer 11/13/2015  . Nausea   . GERD with esophagitis     Past Surgical History:  Procedure Laterality Date  . CHOLECYSTECTOMY     2001  . CYSTOSCOPY W/ URETERAL STENT PLACEMENT  10/07/2011   Procedure: CYSTOSCOPY WITH RETROGRADE PYELOGRAM/URETERAL STENT PLACEMENT;  Surgeon: Hanley Ben, MD;  Location: [facility] ORS;  Service: Urology;  Laterality: Left;  . ESOPHAGOGASTRODUODENOSCOPY N/A 11/13/2015   Procedure: ESOPHAGOGASTRODUODENOSCOPY (EGD);  Surgeon: Daneil Dolin, MD;  Location: [facility] ENDO SUITE;  Service: Endoscopy;  Laterality: N/A;  ADD 25 mg preprocedure Phenergan: start 30-45 minutes before procedure  . RECTAL SURGERY     2001  . TUBAL LIGATION     1986     OB History   No obstetric history on file.     No family history on file.  Social History   Tobacco Use  . Smoking  status: Never Smoker  . Smokeless tobacco: Never Used  Substance Use Topics  . Alcohol use: No  . Drug use: No    Home Medications Prior to Admission medications   Medication Sig Start Date End Date Taking? Authorizing Provider  aspirin EC 81 MG tablet Take 81 mg by mouth daily.   Yes [provider]  clonazePAM (KLONOPIN) 0.5 MG tablet Take 0.5 mg by mouth 2 (two) times daily as needed. For anxiety   Yes [provider]  ciprofloxacin (CIPRO) 500 MG tablet Take 1 tablet (500 mg total) by mouth 2 (two) times daily. One po bid x 7 days 02/15/19   Milton Ferguson, MD  metroNIDAZOLE (FLAGYL) 500 MG tablet Take 1 tablet (500 mg total) by mouth 4 (four) times daily. One po bid x 7 days 02/15/19   Milton Ferguson, MD  ondansetron (ZOFRAN ODT) 4 MG disintegrating tablet 4mg  ODT q4 hours prn nausea/vomit 02/15/19   Milton Ferguson, MD  oxyCODONE-acetaminophen (PERCOCET/ROXICET) 5-325 MG tablet Take 1 tablet by mouth every 6 (six) hours as needed. 02/15/19   Milton Ferguson, MD  pantoprazole (PROTONIX) 40 MG tablet Take 1 tablet (40 mg total) by mouth 2 (two) times daily before a meal. Patient not taking: Reported on 02/15/2019 11/14/15   Verlee Monte, MD    Allergies    Ceclor [cefaclor], Codeine, and Vicodin [  hydrocodone-acetaminophen]  Review of Systems   Review of Systems  Constitutional: Negative for appetite change and fatigue.  HENT: Negative for congestion, ear discharge and sinus pressure.   Eyes: Negative for discharge.  Respiratory: Negative for cough.   Cardiovascular: Negative for chest pain.  Gastrointestinal: Positive for abdominal pain. Negative for diarrhea.  Genitourinary: Negative for frequency and hematuria.  Musculoskeletal: Negative for back pain.  Skin: Negative for rash.  Neurological: Negative for seizures and headaches.  Psychiatric/Behavioral: Negative for hallucinations.    Physical Exam Updated Vital Signs BP (!) 151/81 (BP Location: Right Arm)    Pulse (!) 111   Temp 98.3 F (36.8 C) (Oral)   Resp 18   Ht 5\' 4"  (1.626 m)   Wt 65.8 kg   SpO2 98%   BMI 24.89 kg/m   Physical Exam Vitals and nursing note reviewed.  Constitutional:      Appearance: She is well-developed.  HENT:     Head: Normocephalic.     Nose: Nose normal.  Eyes:     General: No scleral icterus.    Conjunctiva/sclera: Conjunctivae normal.  Neck:     Thyroid: No thyromegaly.  Cardiovascular:     Rate and Rhythm: Normal rate and regular rhythm.     Heart sounds: No murmur. No friction rub. No gallop.   Pulmonary:     Breath sounds: No stridor. No wheezing or rales.  Chest:     Chest wall: No tenderness.  Abdominal:     General: There is no distension.     Tenderness: There is abdominal tenderness. There is no rebound.  Musculoskeletal:        General: Normal range of motion.     Cervical back: Neck supple.  Lymphadenopathy:     Cervical: No cervical adenopathy.  Skin:    Findings: No erythema or rash.  Neurological:     Mental Status: She is oriented to person, place, and time.     Motor: No abnormal muscle tone.     Coordination: Coordination normal.  Psychiatric:        Behavior: Behavior normal.     ED Results / Procedures / Treatments   Labs (all labs ordered are listed, but only abnormal results are displayed) Labs Reviewed  COMPREHENSIVE METABOLIC PANEL - Abnormal; Notable for the following components:      Result Value   Glucose, Bld 103 (*)    Total Protein 8.2 (*)    Total Bilirubin 1.4 (*)    All other components within normal limits  CBC - Abnormal; Notable for the following components:   WBC 11.5 (*)    RDW 11.2 (*)    All other components within normal limits  URINALYSIS, ROUTINE W REFLEX MICROSCOPIC - Abnormal; Notable for the following components:   Ketones, ur 20 (*)    All other components within normal limits  LIPASE, BLOOD    EKG None  Radiology CT ABDOMEN PELVIS W CONTRAST  Result Date:  02/15/2019 CLINICAL DATA:  Acute generalized abdominal pain at mid to lower abdomen for 4 days with cramping, history of chronic kidney disease, diverticulitis, colon cancer with surgery EXAM: CT ABDOMEN AND PELVIS WITH CONTRAST TECHNIQUE: Multidetector CT imaging of the abdomen and pelvis was performed using the standard protocol following bolus administration of intravenous contrast. Sagittal and coronal MPR images reconstructed from axial data set. CONTRAST:  150mL OMNIPAQUE IOHEXOL 300 MG/ML SOLN IV. No oral contrast. COMPARISON:  10/07/2011 FINDINGS: Lower chest: Subpleural nodule lateral LEFT lung base  5 mm diameter image 30 unchanged. Lung bases otherwise clear. Hepatobiliary: Gallbladder surgically absent. Diffuse fatty infiltration of liver. No focal hepatic mass lesion. Pancreas: Normal appearance Spleen: Normal appearance Adrenals/Urinary Tract: Tiny nonobstructing calculus at inferior pole LEFT kidney. Adrenal glands, kidneys, ureters, and bladder otherwise normal appearance. Stomach/Bowel: Normal appendix. Stomach decompressed with tiny hiatal hernia. Small bowel loops unremarkable. Wall thickening of the sigmoid colon with pericolic inflammatory changes and a few scattered colonic diverticula most consistent with acute diverticulitis. No definite extraluminal gas, perforation or abscess. Rectosigmoid anastomotic staple line noted. Remainder of colon unremarkable. Vascular/Lymphatic: Atherosclerotic calcification aorta without aneurysm. No adenopathy. Reproductive: Uterus and adnexa unremarkable for age Other: Small amount of nonspecific free pelvic fluid. No free air. Small supraumbilical and infraumbilical ventral hernias containing fat. Musculoskeletal: Unremarkable IMPRESSION: Acute sigmoid diverticulitis without evidence of abscess or free air. Small amount of nonspecific free pelvic fluid. Supraumbilical and infraumbilical ventral hernias containing fat. Small hiatal hernia. Tiny nonobstructing  LEFT renal calculus. Hepatic steatosis. Electronically Signed   By: Lavonia Dana M.D.   On: 02/15/2019 11:58    Procedures Procedures (including critical care time)  Medications Ordered in ED Medications  ciprofloxacin (CIPRO) tablet 500 mg (has no administration in time range)  metroNIDAZOLE (FLAGYL) tablet 500 mg (has no administration in time range)  sodium chloride 0.9 % bolus 500 mL (0 mLs Intravenous Stopped 02/15/19 1113)  iohexol (OMNIPAQUE) 300 MG/ML solution 100 mL (100 mLs Intravenous Contrast Given 02/15/19 1132)    ED Course  I have reviewed the triage vital signs and the nursing notes.  Pertinent labs & imaging results that were available during my care of the patient were reviewed by me and considered in my medical decision making (see chart for details).    MDM Rules/Calculators/A&P                     Patient with diverticulitis.  She is nontoxic.  She is sent home on Cipro Flagyl Zofran and Percocet.  She will follow-up with her PCP next week Final Clinical Impression(s) / ED Diagnoses Final diagnoses:  Diverticulitis    Rx / DC Orders ED Discharge Orders         Ordered    ciprofloxacin (CIPRO) 500 MG tablet  2 times daily     02/15/19 1300    metroNIDAZOLE (FLAGYL) 500 MG tablet  4 times daily     02/15/19 1300    ondansetron (ZOFRAN ODT) 4 MG disintegrating tablet     02/15/19 1300    oxyCODONE-acetaminophen (PERCOCET/ROXICET) 5-325 MG tablet  Every 6 hours PRN     02/15/19 1301           Milton Ferguson, MD 02/15/19 1307

## 2019-02-15 NOTE — Telephone Encounter (Signed)
Pharmacy called regarding days supply on Rx.  EDCM reviewed chart to find 7 day supply is written.

## 2020-08-11 IMAGING — CT CT ABD-PELV W/ CM
2 of 5 series · 16 of 46 positions shown, 18 images · IV contrast (Omnipaque or Isovue)
Comparison: 10/07/2011

CLINICAL DATA: Acute generalized abdominal pain at mid to lower
abdomen for 4 days with cramping, history of chronic kidney disease,
diverticulitis, colon cancer with surgery

EXAM:
CT ABDOMEN AND PELVIS WITH CONTRAST
TECHNIQUE: Multidetector CT imaging of the abdomen and pelvis was performed
using the standard protocol following bolus administration of
intravenous contrast. Sagittal and coronal MPR images reconstructed
from axial data set.
CONTRAST:  100mL OMNIPAQUE IOHEXOL 300 MG/ML SOLN IV. No oral
contrast.

[Series 2: axial st · axial · 0.75mm/px · z∈[+839,+1204]mm · 13 of 87 slices shown, 15 images]
[im 7/87  soft-tissue]
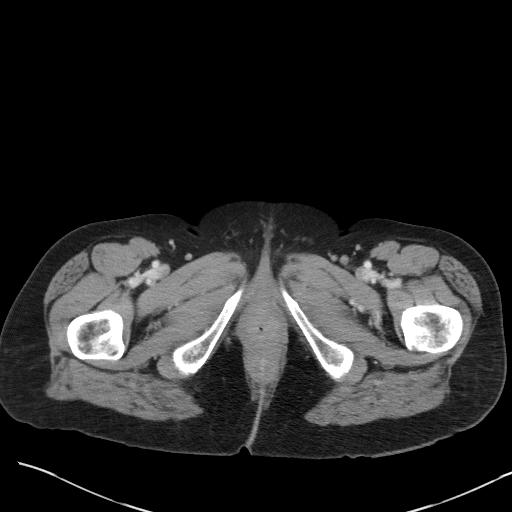
[im 7/87  bone]
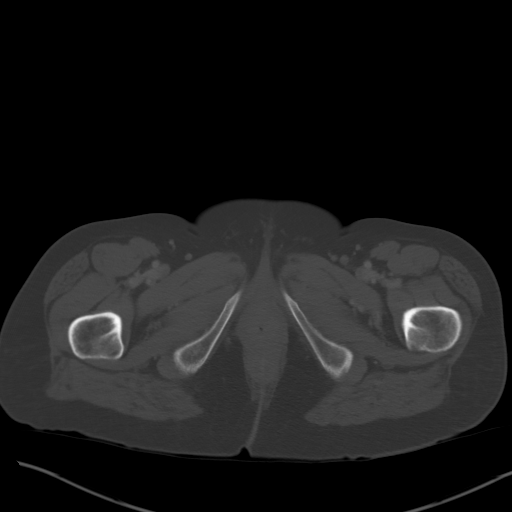
[im 13/87  soft-tissue]
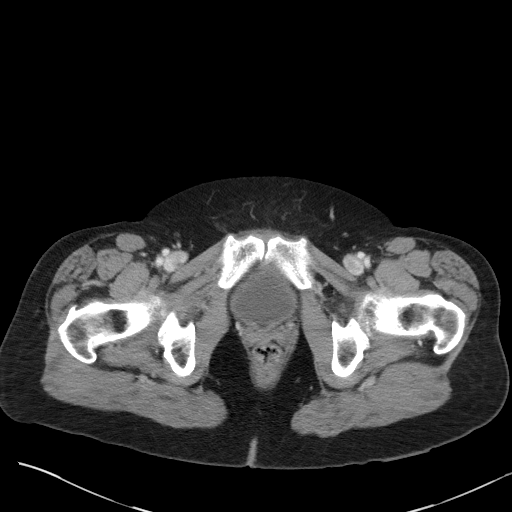
[im 19/87  soft-tissue]
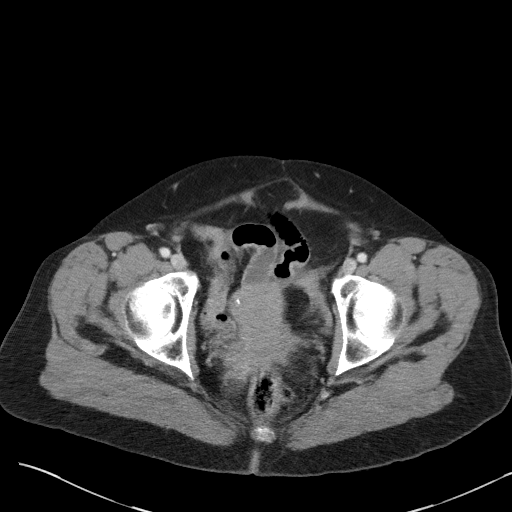
[im 25/87  soft-tissue]
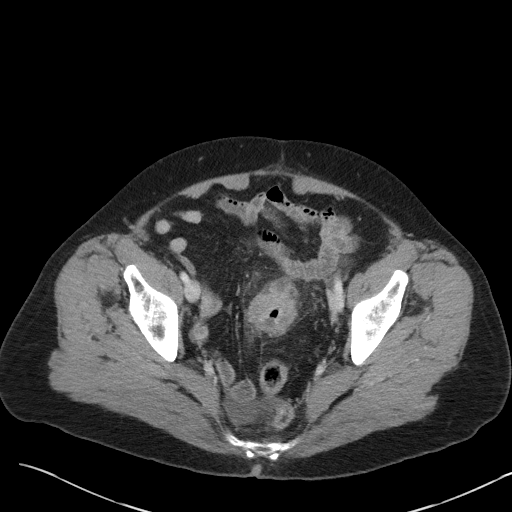
[im 31/87  soft-tissue]
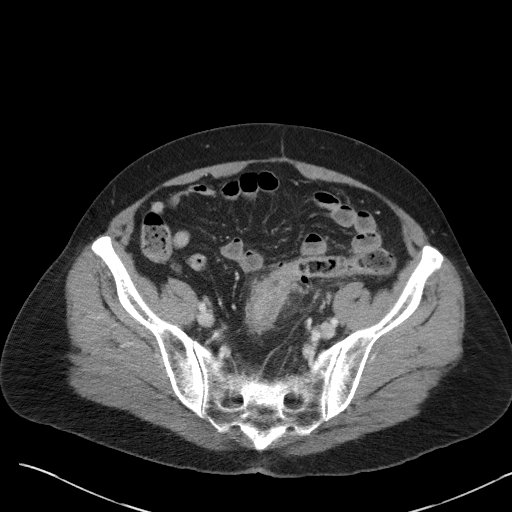
[im 37/87  soft-tissue]
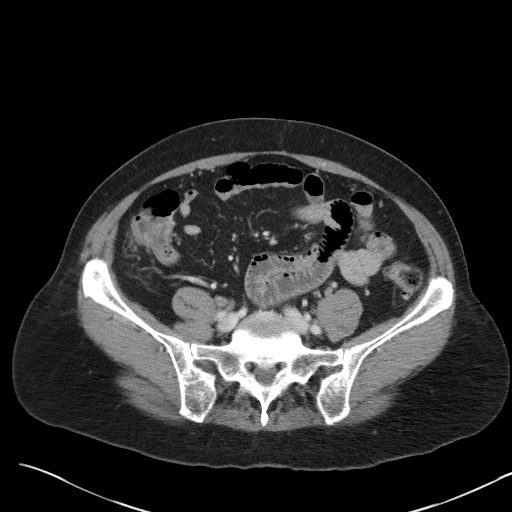
[im 44/87  soft-tissue]
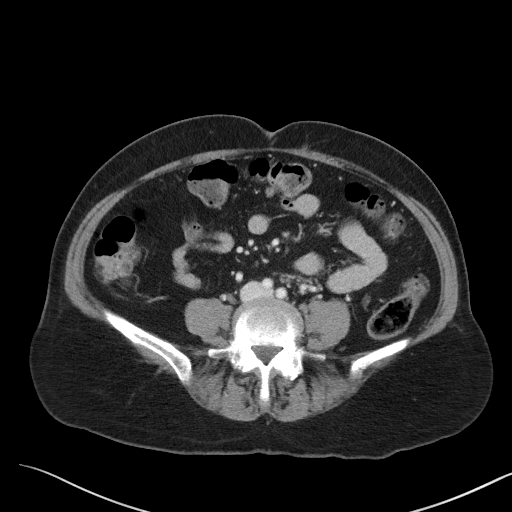
[im 50/87  soft-tissue]
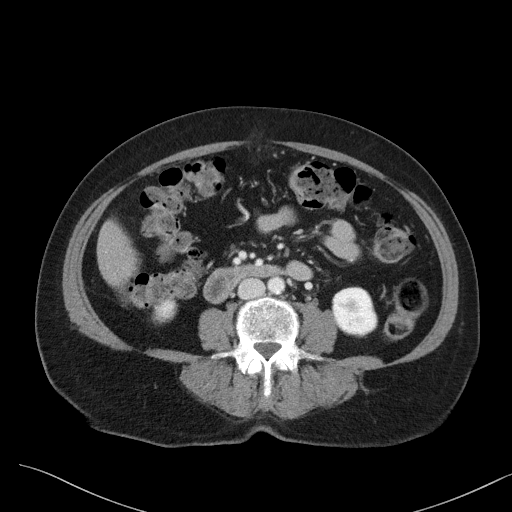
[im 56/87  soft-tissue]
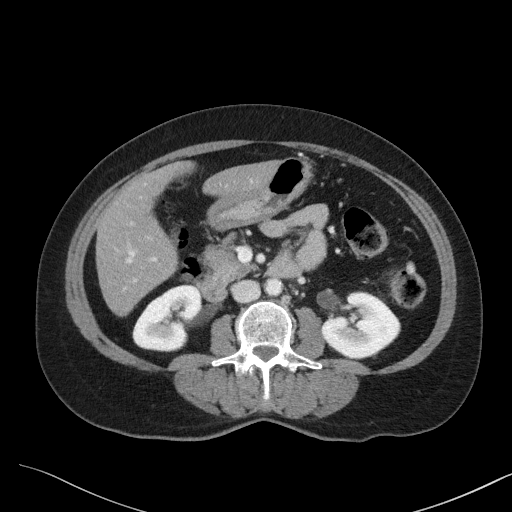
[im 56/87  bone]
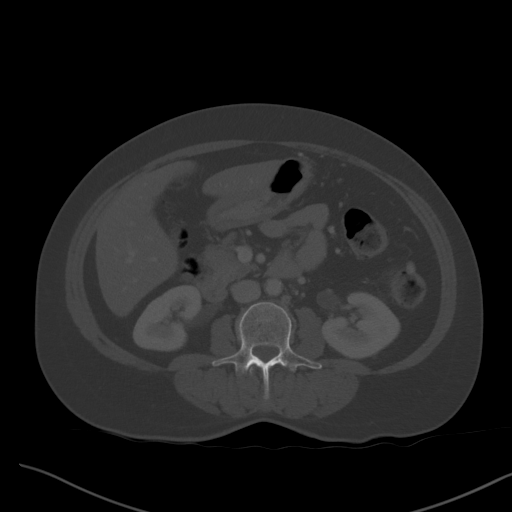
[im 62/87  soft-tissue]
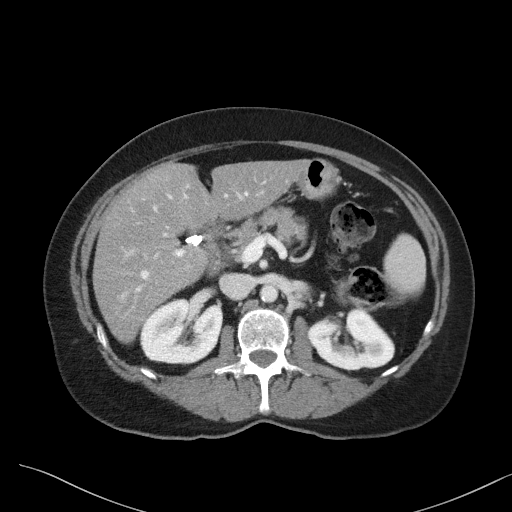
[im 68/87  soft-tissue]
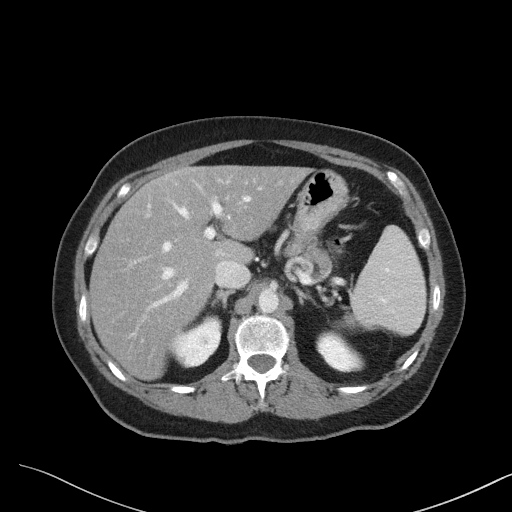
[im 74/87  soft-tissue]
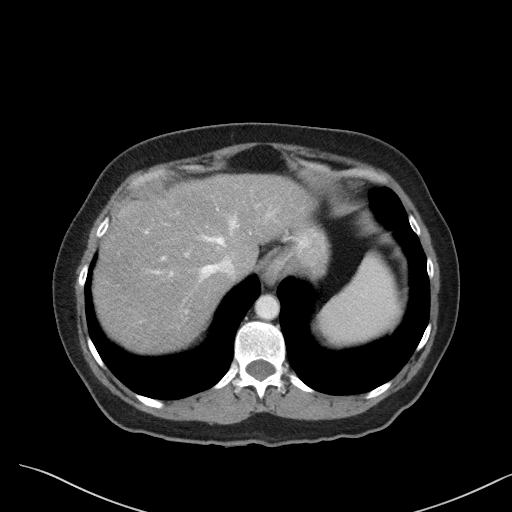
[im 80/87  soft-tissue]
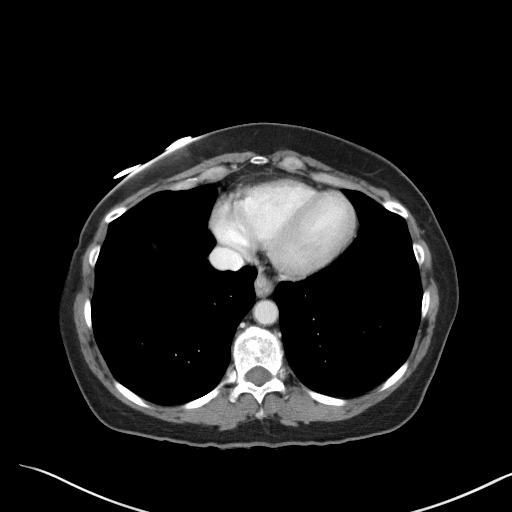

[Series 6: coronal st · coronal · 0.77mm/px · 3 of 98 slices shown]
[im 33/98  soft-tissue]
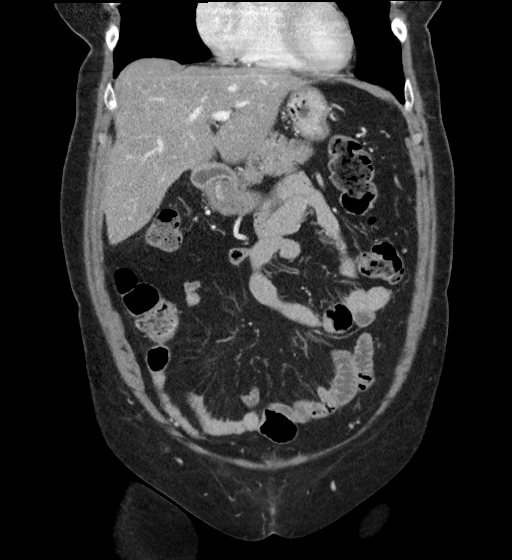
[im 44/98  soft-tissue]
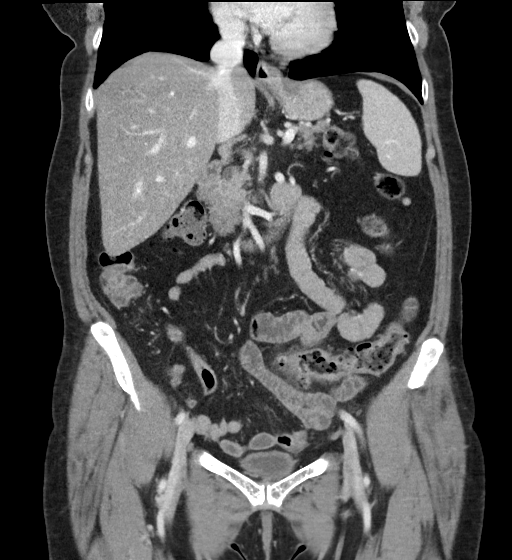
[im 54/98  soft-tissue]
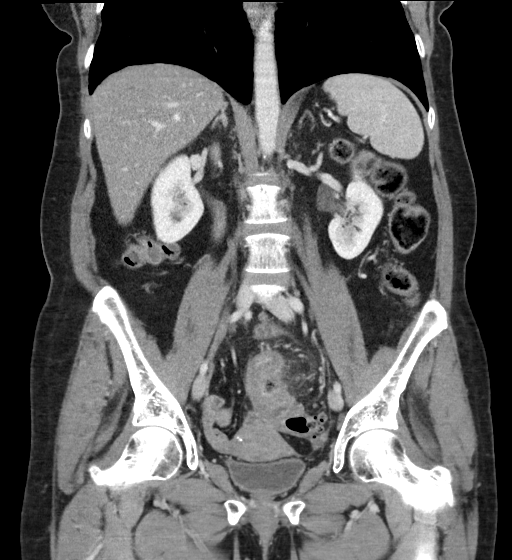

[16 of 46 positions shown; findings below may reference images not displayed]

FINDINGS: Lower chest: Subpleural nodule lateral LEFT lung base 5 mm diameter
image 30 unchanged. Lung bases otherwise clear.

Hepatobiliary: Gallbladder surgically absent. Diffuse fatty
infiltration of liver. No focal hepatic mass lesion.

Pancreas: Normal appearance

Spleen: Normal appearance

Adrenals/Urinary Tract: Tiny nonobstructing calculus at inferior
pole LEFT kidney. Adrenal glands, kidneys, ureters, and bladder
otherwise normal appearance.

Stomach/Bowel: Normal appendix. Stomach decompressed with tiny
hiatal hernia. Small bowel loops unremarkable. Wall thickening of
the sigmoid colon with pericolic inflammatory changes and a few
scattered colonic diverticula most consistent with acute
diverticulitis. No definite extraluminal gas, perforation or
abscess. Rectosigmoid anastomotic staple line noted. Remainder of
colon unremarkable.

Vascular/Lymphatic: Atherosclerotic calcification aorta without
aneurysm. No adenopathy.

Reproductive: Uterus and adnexa unremarkable for age

Other: Small amount of nonspecific free pelvic fluid. No free air.
Small supraumbilical and infraumbilical ventral hernias containing
fat.

Musculoskeletal: Unremarkable
IMPRESSION: Acute sigmoid diverticulitis without evidence of abscess or free
air.

Small amount of nonspecific free pelvic fluid.

Supraumbilical and infraumbilical ventral hernias containing fat.

Small hiatal hernia.

Tiny nonobstructing LEFT renal calculus.

Hepatic steatosis.

## 2022-12-26 ENCOUNTER — Other Ambulatory Visit: Payer: Self-pay

## 2022-12-26 ENCOUNTER — Encounter (HOSPITAL_COMMUNITY): Payer: Self-pay

## 2022-12-26 ENCOUNTER — Emergency Department (HOSPITAL_COMMUNITY): Payer: Medicare Other

## 2022-12-26 ENCOUNTER — Emergency Department (HOSPITAL_COMMUNITY)
Admission: EM | Admit: 2022-12-26 | Discharge: 2022-12-26 | Disposition: A | Discharge status: Home / Self Care | Payer: Medicare Other | Attending: Emergency Medicine | Admitting: Emergency Medicine

## 2022-12-26 DIAGNOSIS — G8929 Other chronic pain: Secondary | ICD-10-CM | POA: Diagnosis not present

## 2022-12-26 DIAGNOSIS — Z7982 Long term (current) use of aspirin: Secondary | ICD-10-CM | POA: Diagnosis not present

## 2022-12-26 DIAGNOSIS — K5730 Diverticulosis of large intestine without perforation or abscess without bleeding: Secondary | ICD-10-CM | POA: Diagnosis not present

## 2022-12-26 DIAGNOSIS — K760 Fatty (change of) liver, not elsewhere classified: Secondary | ICD-10-CM | POA: Diagnosis not present

## 2022-12-26 DIAGNOSIS — M546 Pain in thoracic spine: Secondary | ICD-10-CM | POA: Diagnosis present

## 2022-12-26 DIAGNOSIS — I700 Atherosclerosis of aorta: Secondary | ICD-10-CM | POA: Diagnosis not present

## 2022-12-26 DIAGNOSIS — N200 Calculus of kidney: Secondary | ICD-10-CM | POA: Diagnosis not present

## 2022-12-26 LAB — CBC WITH DIFFERENTIAL/PLATELET
Abs Immature Granulocytes: 0.01 10*3/uL (ref 0.00–0.07)
Basophils Absolute: 0 10*3/uL (ref 0.0–0.1)
Basophils Relative: 1 %
Eosinophils Absolute: 0 10*3/uL (ref 0.0–0.5)
Eosinophils Relative: 1 %
HCT: 41.5 % (ref 36.0–46.0)
Hemoglobin: 14.1 g/dL (ref 12.0–15.0)
Immature Granulocytes: 0 %
Lymphocytes Relative: 47 %
Lymphs Abs: 2.8 10*3/uL (ref 0.7–4.0)
MCH: 29.9 pg (ref 26.0–34.0)
MCHC: 34 g/dL (ref 30.0–36.0)
MCV: 87.9 fL (ref 80.0–100.0)
Monocytes Absolute: 0.6 10*3/uL (ref 0.1–1.0)
Monocytes Relative: 10 %
Neutro Abs: 2.3 10*3/uL (ref 1.7–7.7)
Neutrophils Relative %: 41 %
Platelets: 255 10*3/uL (ref 150–400)
RBC: 4.72 MIL/uL (ref 3.87–5.11)
RDW: 11.7 % (ref 11.5–15.5)
WBC: 5.8 10*3/uL (ref 4.0–10.5)
nRBC: 0 % (ref 0.0–0.2)

## 2022-12-26 LAB — BASIC METABOLIC PANEL
Anion gap: 11 (ref 5–15)
BUN: 11 mg/dL (ref 8–23)
CO2: 26 mmol/L (ref 22–32)
Calcium: 9.6 mg/dL (ref 8.9–10.3)
Chloride: 100 mmol/L (ref 98–111)
Creatinine, Ser: 0.55 mg/dL (ref 0.44–1.00)
GFR, Estimated: >60 mL/min (ref >60)
Glucose, Bld: 80 mg/dL (ref 70–99)
Potassium: 4.5 mmol/L (ref 3.5–5.1)
Sodium: 137 mmol/L (ref 135–145)

## 2022-12-26 LAB — URINALYSIS, W/ REFLEX TO CULTURE (INFECTION SUSPECTED)
Bacteria, UA: NONE SEEN
Bilirubin Urine: NEGATIVE
Glucose, UA: NEGATIVE mg/dL
Hgb urine dipstick: NEGATIVE
Ketones, ur: 20 mg/dL — AB
Leukocytes,Ua: NEGATIVE
Nitrite: NEGATIVE
Protein, ur: NEGATIVE mg/dL
Specific Gravity, Urine: 1.015 (ref 1.005–1.030)
pH: 5 (ref 5.0–8.0)

## 2022-12-26 NOTE — ED Triage Notes (Signed)
Mid back pain on and off "its just been so long" 6-9 months Hurts more to stand and move

## 2022-12-26 NOTE — ED Provider Notes (Signed)
[place] EMERGENCY DEPARTMENT AT [facility] Provider Note   CSN: [id number] Arrival date & time: 12/26/22  1302     History  Chief Complaint  Patient presents with   Back Pain    Amy Jones is a 67 y.o. female.  With a history of arthritis, epilepsy, presenting to the ED for evaluation of back pain.  Pain is localized to the mid thoracic back, typically around the T8 region.  Pain is worse with specific movements and resolved with rest pain is been present for the past 9 months or so and has progressively gotten worse.  She has been taking care of her grandchildren lately and been lifting and picking them up a lot which made the pain acutely worse.  She denies any lower back pain.  She has a history of kidney stones and is wondering if she has a recurrence of this.  She denies any dysuria, frequency, urgency, hematuria, saddle anesthesia, numbness, weakness, tingling, urinary or fecal incontinence, fevers, history of injection drug use.  She denies any trauma.  Chart notes history of CKD but patient denies this.    Back Pain      Home Medications Prior to Admission medications   Medication Sig Start Date End Date Taking? Authorizing Provider  aspirin EC 81 MG tablet Take 81 mg by mouth daily.    [provider]  ciprofloxacin (CIPRO) 500 MG tablet Take 1 tablet (500 mg total) by mouth 2 (two) times daily. One po bid x 7 days 02/15/19   Bethann Berkshire, MD  clonazePAM (KLONOPIN) 0.5 MG tablet Take 0.5 mg by mouth 2 (two) times daily as needed. For anxiety    [provider]  metroNIDAZOLE (FLAGYL) 500 MG tablet Take 1 tablet (500 mg total) by mouth 4 (four) times daily. One po bid x 7 days 02/15/19   Bethann Berkshire, MD  ondansetron (ZOFRAN ODT) 4 MG disintegrating tablet 4mg  ODT q4 hours prn nausea/vomit 02/15/19   Bethann Berkshire, MD  oxyCODONE-acetaminophen (PERCOCET/ROXICET) 5-325 MG tablet Take 1 tablet by mouth every 6 (six) hours as needed. 02/15/19    Bethann Berkshire, MD  pantoprazole (PROTONIX) 40 MG tablet Take 1 tablet (40 mg total) by mouth 2 (two) times daily before a meal. Patient not taking: Reported on 02/15/2019 11/14/15   Clydia Llano, MD      Allergies    Ceclor [cefaclor], Codeine, and Vicodin [hydrocodone-acetaminophen]    Review of Systems   Review of Systems  Musculoskeletal:  Positive for back pain.  All other systems reviewed and are negative.   Physical Exam Updated Vital Signs BP (!) 146/71 (BP Location: Right Arm)   Pulse 100   Temp 99 F (37.2 C) (Oral)   Resp 16   Ht 5\' 4"  (1.626 m)   Wt 65.8 kg   SpO2 99%   BMI 24.89 kg/m  Physical Exam Vitals and nursing note reviewed.  Constitutional:      General: She is not in acute distress.    Appearance: Normal appearance. She is normal weight. She is not ill-appearing.     Comments: Resting comfortably in bed  HENT:     Head: Normocephalic and atraumatic.  Pulmonary:     Effort: Pulmonary effort is normal. No respiratory distress.  Abdominal:     General: Abdomen is flat.  Musculoskeletal:        General: Normal range of motion.     Cervical back: Neck supple.     Comments: Mild bilateral paraspinal  TTP to T6-T10 without specific midline TTP, step-offs, deformities, crepitus  Skin:    General: Skin is warm and dry.  Neurological:     Mental Status: She is alert and oriented to person, place, and time.  Psychiatric:        Mood and Affect: Mood normal.        Behavior: Behavior normal.     ED Results / Procedures / Treatments   Labs (all labs ordered are listed, but only abnormal results are displayed) Labs Reviewed  URINALYSIS, W/ REFLEX TO CULTURE (INFECTION SUSPECTED) - Abnormal; Notable for the following components:      Result Value   Ketones, ur 20 (*)    All other components within normal limits  BASIC METABOLIC PANEL  CBC WITH DIFFERENTIAL/PLATELET    EKG None  Radiology CT Renal Stone Study  Result Date: 12/26/2022 CLINICAL  DATA:  Bilateral flank pain for 6 months. EXAM: CT ABDOMEN AND PELVIS WITHOUT CONTRAST TECHNIQUE: Multidetector CT imaging of the abdomen and pelvis was performed following the standard protocol without IV contrast. RADIATION DOSE REDUCTION: This exam was performed according to the departmental dose-optimization program which includes automated exposure control, adjustment of the mA and/or kV according to patient size and/or use of iterative reconstruction technique. COMPARISON:  02/15/2019. FINDINGS: Lower chest: No acute findings. 4 mm pleural base nodule, left lower lobe, image 29, series 3, no change from 02/15/2019, benign. Hepatobiliary: Liver normal in size. Decreased liver attenuation consistent with fatty infiltration. No mass. Status post cholecystectomy. No bile duct dilation. Pancreas: Unremarkable. No pancreatic ductal dilatation or surrounding inflammatory changes. Spleen: Normal in size without focal abnormality. Adrenals/Urinary Tract: Normal adrenal glands. Kidneys normal size, orientation and position. 3 mm nonobstructing stone in the lower pole the left kidney. No other intrarenal stones. No renal masses. No hydronephrosis. Ureters are normal in course and in caliber. Bladder is unremarkable. Stomach/Bowel: Normal stomach. Small bowel and colon are normal in caliber. No wall thickening. No inflammatory changes. Left colon diverticula most evident along the sigmoid. No diverticulitis. Rectosigmoid anastomosis is stable from the prior CT. Normal appendix. Vascular/Lymphatic: Mild aortic atherosclerotic calcifications. Normal caliber aorta. No enlarged lymph nodes. Reproductive: Uterus and bilateral adnexa are unremarkable. Other: Small fat containing supraumbilical hernia similar to the prior CT. No ascites. Musculoskeletal: No fracture or acute finding.  No bone lesion. IMPRESSION: 1. No acute findings within the abdomen or pelvis. No findings to account for flank pain. 2. 3 mm nonobstructing left  lower pole renal stone. No other urinary tract stones. No hydronephrosis. 3. Hepatic steatosis. 4. Left colon diverticulosis without evidence of diverticulitis. 5. Aortic atherosclerosis. Aortic Atherosclerosis (ICD10-I70.0). Electronically Signed   By: Amie Portland M.D.   On: 12/26/2022 14:20    Procedures Procedures    Medications Ordered in ED Medications - No data to display  ED Course/ Medical Decision Making/ A&P                                 Medical Decision Making Amount and/or Complexity of Data Reviewed Labs: ordered. Radiology: ordered.  This patient presents to the ED for concern of back pain, this involves an extensive number of treatment options, and is a complaint that carries with it a high risk of complications and morbidity.  The emergent differential diagnosis for back pain includes but is not limited to fracture, muscle strain, cauda equina, spinal stenosis. DDD, ankylosing spondylitis, acute ligamentous injury,  disk herniation, spondylolisthesis, Epidural compression syndrome, metastatic cancer, transverse myelitis, vertebral osteomyelitis, diskitis, kidney stone, pyelonephritis, AAA, Perforated ulcer, Retrocecal appendicitis, pancreatitis, bowel obstruction, retroperitoneal hemorrhage or mass, meningitis.   My initial workup includes labs, imaging.  Patient declines pain control  Additional history obtained from: Nursing notes from this visit.  I ordered, reviewed and interpreted labs which include: CBC, BMP, urinalysis no leukocytosis or anemia.  No electrolyte derangement or kidney dysfunction.  No evidence of UTI.  I ordered imaging studies including CT stone study I independently visualized and interpreted imaging which showed no acute abnormalities I agree with the radiologist interpretation  Afebrile, hypertensive but otherwise hemodynamically stable.  67 year old female presenting for evaluation of atraumatic mid thoracic back pain.  Symptoms have been  present for the past 9 months or so.  Worse with specific movements and lifting, improved with rest.  She has been attempting to stay away from pain medications she denies any red flag symptoms to suggest cord compression.  Lab workup reassuring.  CT stone study ordered at patient request which was negative for acute abnormalities.  Overall suspect musculoskeletal pain.  She was encouraged to use lidocaine patches and take Tylenol and ibuprofen as needed.  She was encouraged to follow-up with her primary care provider in 1 week for reevaluation of her symptoms.  She was given return precautions.  Stable for discharge.  At this time there does not appear to be any evidence of an acute emergency medical condition and the patient appears stable for discharge with appropriate outpatient follow up. Diagnosis was discussed with patient who verbalizes understanding of care plan and is agreeable to discharge. I have discussed return precautions with patient who verbalizes understanding. Patient encouraged to follow-up with their PCP within 1 week. All questions answered.  Patient's case discussed with Dr. Particia Nearing who agrees with plan to discharge with follow-up.   Note: Portions of this report may have been transcribed using voice recognition software. Every effort was made to ensure accuracy; however, inadvertent computerized transcription errors may still be present.         Final Clinical Impression(s) / ED Diagnoses Final diagnoses:  Chronic bilateral thoracic back pain    Rx / DC Orders ED Discharge Orders     None         Michelle Piper, Cordelia Poche 12/26/22 1524    Jacalyn Lefevre, MD 12/26/22 [phone]

## 2022-12-26 NOTE — Discharge Instructions (Signed)
You have been seen today for your complaint of back pain. Your lab work was reassuring. Your imaging was reassuring. Your discharge medications include Alternate tylenol and ibuprofen for pain. You may alternate these every 4 hours. You may take up to 800 mg of ibuprofen at a time and up to 1000 mg of tylenol. Lidocaine patches.  Apply 1 patch every 12 hours as needed. Follow up with: Your primary care provider in 1 week for reevaluation Please seek immediate medical care if you develop any of the following symptoms: You are not able to control when you pee or poop. You have severe back pain and: Nausea or vomiting. Pain in your chest or abdomen. Shortness of breath. You faint. At this time there does not appear to be the presence of an emergent medical condition, however there is always the potential for conditions to change. Please read and follow the below instructions.  Do not take your medicine if  develop an itchy rash, swelling in your mouth or lips, or difficulty breathing; call 911 and seek immediate emergency medical attention if this occurs.  You may review your lab tests and imaging results in their entirety on your MyChart account.  Please discuss all results of fully with your primary care provider and other specialist at your follow-up visit.  Note: Portions of this text may have been transcribed using voice recognition software. Every effort was made to ensure accuracy; however, inadvertent computerized transcription errors may still be present.
